# Patient Record
Sex: Female | Born: 1985 | Hispanic: Yes | Marital: Married | Smoking: Never smoker
Health system: Southern US, Community
[De-identification: ages and names within clinical notes are randomized; demographics above are authoritative.]

## PROBLEM LIST (undated history)

## (undated) HISTORY — PX: NO PAST SURGERIES: SHX2092

---

## 2012-09-07 NOTE — L&D Delivery Note (Signed)
Delivery Note At 1:54 AM a viable female was delivered via Vaginal, Spontaneous Delivery (Presentation: Left Occiput Anterior).  APGAR: 4, 8; weight 4 lb 11.6 oz (2143 g).   Placenta status: Intact, Spontaneous Pathology.  Cord: 3 vessels with the following complications: None.  Cord pH: 7.25  Anesthesia: None  Episiotomy: None Lacerations: 1st degree Suture Repair: 3.0 vicryl Est. Blood Loss (mL): 300  Mom to postpartum.  Baby to nursery-stable.  Pt with progressively stronger contractions progressed to c/c/+2. She pushed with good effort to deliver a liveborn viable preterm female infant. Nuchal x1 reduced on perineum. Cord was clamped and cut immediately and infant was delivered to the waiting NICU team.  Cord pH of 7.25.  Placenta delivered spontaneously intact with 3V cord. Small 1st degree repaired with single stitch.  Mom to 3rd floor and baby to NICU.   [name] 06/13/2013, 2:24 AM

## 2013-02-09 ENCOUNTER — Other Ambulatory Visit (HOSPITAL_COMMUNITY): Payer: Self-pay | Admitting: Physician Assistant

## 2013-02-09 DIAGNOSIS — Z3689 Encounter for other specified antenatal screening: Secondary | ICD-10-CM

## 2013-02-09 LAB — OB RESULTS CONSOLE HEPATITIS B SURFACE ANTIGEN: Hepatitis B Surface Ag: NEGATIVE

## 2013-02-09 LAB — OB RESULTS CONSOLE HGB/HCT, BLOOD: Hemoglobin: 11.6 g/dL

## 2013-02-09 LAB — OB RESULTS CONSOLE ANTIBODY SCREEN: Antibody Screen: NEGATIVE

## 2013-02-09 LAB — OB RESULTS CONSOLE ABO/RH: RH Type: POSITIVE

## 2013-02-09 LAB — OB RESULTS CONSOLE GC/CHLAMYDIA
Chlamydia: NEGATIVE
Gonorrhea: NEGATIVE

## 2013-02-09 LAB — OB RESULTS CONSOLE RPR: RPR: NONREACTIVE

## 2013-02-09 LAB — CYTOLOGY - PAP: Pap: NEGATIVE

## 2013-02-14 ENCOUNTER — Encounter (HOSPITAL_COMMUNITY): Payer: Self-pay

## 2013-02-14 ENCOUNTER — Ambulatory Visit (HOSPITAL_COMMUNITY)
Admission: RE | Admit: 2013-02-14 | Discharge: 2013-02-14 | Disposition: A | Discharge status: Home / Self Care | Payer: Self-pay | Source: Ambulatory Visit | Attending: Physician Assistant | Admitting: Physician Assistant

## 2013-02-14 DIAGNOSIS — Z3689 Encounter for other specified antenatal screening: Secondary | ICD-10-CM

## 2013-02-14 DIAGNOSIS — O358XX0 Maternal care for other (suspected) fetal abnormality and damage, not applicable or unspecified: Secondary | ICD-10-CM | POA: Insufficient documentation

## 2013-02-14 DIAGNOSIS — Z363 Encounter for antenatal screening for malformations: Secondary | ICD-10-CM | POA: Insufficient documentation

## 2013-02-14 DIAGNOSIS — Z1389 Encounter for screening for other disorder: Secondary | ICD-10-CM | POA: Insufficient documentation

## 2013-02-16 ENCOUNTER — Other Ambulatory Visit (HOSPITAL_COMMUNITY): Payer: Self-pay | Admitting: Physician Assistant

## 2013-02-16 DIAGNOSIS — Z0489 Encounter for examination and observation for other specified reasons: Secondary | ICD-10-CM

## 2013-02-22 ENCOUNTER — Encounter: Payer: Self-pay | Admitting: *Deleted

## 2013-02-23 ENCOUNTER — Encounter: Payer: Self-pay | Admitting: Family Medicine

## 2013-02-23 ENCOUNTER — Ambulatory Visit (INDEPENDENT_AMBULATORY_CARE_PROVIDER_SITE_OTHER): Payer: Self-pay | Admitting: Family Medicine

## 2013-02-23 ENCOUNTER — Encounter: Payer: Self-pay | Admitting: *Deleted

## 2013-02-23 VITALS — BP 112/69 | Temp 97.9°F | Ht 59.5 in | Wt 136.3 lb

## 2013-02-23 DIAGNOSIS — IMO0002 Reserved for concepts with insufficient information to code with codable children: Secondary | ICD-10-CM

## 2013-02-23 DIAGNOSIS — O0990 Supervision of high risk pregnancy, unspecified, unspecified trimester: Secondary | ICD-10-CM | POA: Insufficient documentation

## 2013-02-23 DIAGNOSIS — O09212 Supervision of pregnancy with history of pre-term labor, second trimester: Secondary | ICD-10-CM

## 2013-02-23 DIAGNOSIS — O09219 Supervision of pregnancy with history of pre-term labor, unspecified trimester: Secondary | ICD-10-CM | POA: Insufficient documentation

## 2013-02-23 DIAGNOSIS — Q897 Multiple congenital malformations, not elsewhere classified: Secondary | ICD-10-CM

## 2013-02-23 DIAGNOSIS — O09299 Supervision of pregnancy with other poor reproductive or obstetric history, unspecified trimester: Secondary | ICD-10-CM | POA: Insufficient documentation

## 2013-02-23 LAB — POCT URINALYSIS DIP (DEVICE)
Bilirubin Urine: NEGATIVE
Ketones, ur: NEGATIVE mg/dL

## 2013-02-23 MED ORDER — HYDROXYPROGESTERONE CAPROATE 250 MG/ML IM OIL
250.0000 mg | TOPICAL_OIL | INTRAMUSCULAR | Status: DC
  Administered 2013-03-09 – 2013-06-08 (×13): 250 mg via INTRAMUSCULAR

## 2013-02-23 NOTE — Patient Instructions (Signed)
Embarazo - Segundo trimestre (Pregnancy - Second Trimester) El segundo trimestre del embarazo (del 3 al 6 mes) es un perodo de evolucin rpida para usted y el beb. Hacia el final del sexto mes, el beb mide aproximadamente 23 cm y pesa 680 g. Comenzar a sentir los movimientos del beb entre las 18 y las 20 semanas de embarazo. Podr sentir las pataditas ("quickening en ingls"). Hay un rpido aumento de peso. Puede segregar un lquido claro (calostro) de las mamas. Quizs sienta pequeas contracciones en el vientre (tero) Esto se conoce como falso trabajo de parto o contracciones de Braxton-Hicks. Es como una prctica del trabajo de parto que se produce cuando el beb est listo para salir. Generalmente los problemas de vmitos matinales ya se han superado hacia el final del primer trimestre. Algunas mujeres desarrollan pequeas manchas oscuras (que se denominan cloasma, mscara del embarazo) en la cara que normalmente se van luego del nacimiento del beb. La exposicin al sol empeora las manchas. Puede desarrollarse acn en algunas mujeres embarazadas, y puede desaparecer en aquellas que ya tienen acn. EXAMENES PRENATALES  Durante los exmenes prenatales, deber seguir realizando pruebas de sangre, segn avance el embarazo. Estas pruebas se realizan para controlar su salud y la del beb. Tambin se realizan anlisis de sangre para conocer los niveles de hemoglobina. La anemia (bajo nivel de hemoglobina) es frecuente durante el embarazo. Para prevenirla, se administran hierro y vitaminas. Tambin se le realizarn exmenes para saber si tiene diabetes entre las 24 y las 28 semanas del embarazo. Podrn repetirle algunas de las pruebas que le hicieron previamente.  En cada visita le medirn el tamao del tero. Esto se realiza para asegurarse de que el beb est creciendo correctamente de acuerdo al estado del embarazo.  Tambin en cada visita prenatal controlarn su presin arterial. Esto se realiza  para asegurarse de que no tenga toxemia.  Se controlar su orina para asegurarse de que no tenga infecciones, diabetes o protena en la orina.  Se controlar su peso regularmente para asegurarse que el aumento ocurre al ritmo indicado. Esto se hace para asegurarse que usted y el beb tienen una evolucin normal.  En algunas ocasiones se realiza una prueba de ultrasonido para confirmar el correcto desarrollo y evolucin del beb. Esta prueba se realiza con ondas sonoras inofensivas para el beb, de modo que el profesional pueda calcular ms precisamente la fecha del parto. Algunas veces se realizan pruebas especializadas del lquido amnitico que rodea al beb. Esta prueba se denomina amniocentesis. El lquido amnitico se obtiene introduciendo una aguja en el vientre (abdomen). Se realiza para controlar los cromosomas en aquellos casos en los que existe alguna preocupacin acerca de algn problema gentico que pueda sufrir el beb. En ocasiones se lleva a cabo cerca del final del embarazo, si es necesario inducir al parto. En este caso se realiza para asegurarse que los pulmones del beb estn lo suficientemente maduros como para que pueda vivir fuera del tero. CAMBIOS QUE OCURREN EN EL SEGUNDO TRIMESTRE DEL EMBARAZO Su organismo atravesar numerosos cambios durante el embarazo. Estos pueden variar de una persona a otra. Converse con el profesional que la asiste acerca los cambios que usted note y que la preocupen.  Durante el segundo trimestre probablemente sienta un aumento del apetito. Es normal tener "antojos" de ciertas comidas. Esto vara de una persona a otra y de un embarazo a otro.  El abdomen inferior comenzar a abultarse.  Podr tener la necesidad de orinar con ms frecuencia debido a   que el tero y el beb presionan sobre la vejiga. Tambin es frecuente contraer ms infecciones urinarias durante el embarazo. Puede evitarlas bebiendo gran cantidad de lquidos y vaciando la vejiga antes y  despus de mantener relaciones sexuales.  Podrn aparecer las primeras estras en las caderas, abdomen y mamas. Estos son cambios normales del cuerpo durante el embarazo. No existen medicamentos ni ejercicios que puedan prevenir estos cambios.  Es posible que comience a desarrollar venas inflamadas y abultadas (varices) en las piernas. El uso de medias de descanso, elevar sus pies durante 15 minutos, 3 a 4 veces al da y limitar la sal en su dieta ayuda a aliviar el problema.  Podr sentir acidez gstrica a medida que el tero crece y presiona contra el estmago. Puede tomar anticidos, con la autorizacin de su mdico, para aliviar este problema. Tambin es til ingerir pequeas comidas 4 a 5 veces al da.  La constipacin puede tratarse con un laxante o agregando fibra a su dieta. Beber grandes cantidades de lquidos, comer vegetales, frutas y granos integrales es de gran ayuda.  Tambin es beneficioso practicar actividad fsica. Si ha sido una persona activa hasta el embarazo, podr continuar con la mayora de las actividades durante el mismo. Si ha sido menos activa, puede ser beneficioso que comience con un programa de ejercicios, como realizar caminatas.  Puede desarrollar hemorroides hacia el final del segundo trimestre. Tomar baos de asiento tibios y utilizar cremas recomendadas por el profesional que lo asiste sern de ayuda para los problemas de hemorroides.  Tambin podr sentir dolor de espalda durante este momento de su embarazo. Evite levantar objetos pesados, utilice zapatos de taco bajo y mantenga una buena postura para ayudar a reducir los problemas de espalda.  Algunas mujeres embarazadas desarrollan hormigueo y adormecimiento de la mano y los dedos debido a la hinchazn y compresin de los ligamentos de la mueca (sndrome del tnel carpiano). Esto desaparece una vez que el beb nace.  Como sus pechos se agrandan, necesitar un sujetador ms grande. Use un sostn de soporte,  cmodo y de algodn. No utilice un sostn para amamantar hasta el ltimo mes de embarazo si va a amamantar al beb.  Podr observar una lnea oscura desde el ombligo hacia la zona pbica denominada linea nigra.  Podr observar que sus mejillas se ponen coloradas debido al aumento de flujo sanguneo en la cara.  Podr desarrollar "araitas" en la cara, cuello y pecho. Esto desaparece una vez que el beb nace. INSTRUCCIONES PARA EL CUIDADO DOMICILIARIO  Es extremadamente importante que evite el cigarrillo, hierbas medicinales, alcohol y las drogas no prescriptas durante el embarazo. Estas sustancias qumicas afectan la formacin y el desarrollo del beb. Evite estas sustancias durante todo el embarazo para asegurar el nacimiento de un beb sano.  La mayor parte de los cuidados que se aconsejan son los mismos que los indicados para el primer trimestre del embarazo. Cumpla con las citas tal como se le indic. Siga las instrucciones del profesional que lo asiste con respecto al uso de los medicamentos, el ejercicio y la dieta.  Durante el embarazo debe obtener nutrientes para usted y para su beb. Consuma alimentos balanceados a intervalos regulares. Elija alimentos como carne, pescado, leche y otros productos lcteos descremados, vegetales, frutas, panes integrales y cereales. El profesional le informar cul es el aumento de peso ideal.  Las relaciones sexuales fsicas pueden continuarse hasta cerca del fin del embarazo si no existen otros problemas. Estos problemas pueden ser la prdida temprana (  prematura) de lquido amnitico de las membranas, sangrado vaginal, dolor abdominal u otros problemas mdicos o del embarazo.  Realice actividad fsica todos los das, si no tiene restricciones. Consulte con el profesional que la asiste si no sabe con certeza si determinados ejercicios son seguros. El mayor aumento de peso tiene lugar durante los ltimos 2 trimestres del embarazo. El ejercicio la ayudar  a:  Controlar su peso.  Ponerla en forma para el parto.  Ayudarla a perder peso luego de haber dado a luz.  Use un buen sostn o como los que se usan para hacer deportes para aliviar la sensibilidad de las mamas. Tambin puede serle til si lo usa mientras duerme. Si pierde calostro, podr utilizar apsitos en el sostn.  No utilice la baera con agua caliente, baos turcos y saunas durante el embarazo.  Utilice el cinturn de seguridad sin excepcin cuando conduzca. Este la proteger a usted y al beb en caso de accidente.  Evite comer carne cruda, queso crudo, y el contacto con los utensilios y desperdicios de los gatos. Estos elementos contienen grmenes que pueden causar defectos de nacimiento en el beb.  El segundo trimestre es un buen momento para visitar a su dentista y evaluar su salud dental si an no lo ha hecho. Es importante mantener los dientes limpios. Utilice un cepillo de dientes blando. Cepllese ms suavemente durante el embarazo.  Es ms fcil perder algo de orina durante el embarazo. Apretar y fortalecer los msculos de la pelvis la ayudar con este problema. Practique detener la miccin cuando est en el bao. Estos son los mismos msculos que necesita fortalecer. Son tambin los mismos msculos que utiliza cuando trata de evitar los gases. Puede practicar apretando estos msculos 10 veces, y repetir esto 3 veces por da aproximadamente. Una vez que conozca qu msculos debe apretar, no realice estos ejercicios durante la miccin. Puede favorecerle una infeccin si la orina vuelve hacia atrs.  Pida ayuda si tiene necesidades econmicas, de asesoramiento o nutricionales durante el embarazo. El profesional podr ayudarla con respecto a estas necesidades, o derivarla a otros especialistas.  La piel puede ponerse grasa. Si esto sucede, lvese la cara con un jabn suave, utilice un humectante no graso y maquillaje con base de aceite o crema. CONSUMO DE MEDICAMENTOS Y DROGAS  DURANTE EL EMBARAZO  Contine tomando las vitaminas apropiadas para esta etapa tal como se le indic. Las vitaminas deben contener un miligramo de cido flico y deben suplementarse con hierro. Guarde todas las vitaminas fuera del alcance de los nios. La ingestin de slo un par de vitaminas o tabletas que contengan hierro puede ocasionar la muerte en un beb o en un nio pequeo.  Evite el uso de medicamentos, inclusive los de venta libre y hierbas que no hayan sido prescriptos o indicados por el profesional que la asiste. Algunos medicamentos pueden causar problemas fsicos al beb. Utilice los medicamentos de venta libre o de prescripcin para el dolor, el malestar o la fiebre, segn se lo indique el profesional que lo asiste. No utilice aspirina.  El consumo de alcohol est relacionado con ciertos defectos de nacimiento. Esto incluye el sndrome de alcoholismo fetal. Debe evitar el consumo de alcohol en cualquiera de sus formas. El cigarrillo causa nacimientos prematuros y bebs de bajo peso. El uso de drogas recreativas est absolutamente prohibido. Son muy nocivas para el beb. Un beb que nace de una madre adicta, ser adicto al nacer. Ese beb tendr los mismos sntomas de abstinencia que un adulto.    Infrmele al profesional si consume alguna droga.  No consuma drogas ilegales. Pueden causarle mucho dao al beb. SOLICITE ATENCIN MDICA SI: Tiene preguntas o preocupaciones durante su embarazo. Es mejor que llame para consultar las dudas que esperar hasta su prxima visita prenatal. De esta forma se sentir ms tranquila.  SOLICITE ATENCIN MDICA DE INMEDIATO SI:  La temperatura oral se eleva sin motivo por encima de 102 F (38.9 C) o segn le indique el profesional que lo asiste.  Tiene una prdida de lquido por la vagina (canal de parto). Si sospecha una ruptura de las membranas, tmese la temperatura y llame al profesional para informarlo sobre esto.  Observa unas pequeas manchas,  una hemorragia vaginal o elimina cogulos. Notifique al profesional acerca de la cantidad y de cuntos apsitos est utilizando. Unas pequeas manchas de sangre son algo comn durante el embarazo, especialmente despus de mantener relaciones sexuales.  Presenta un olor desagradable en la secrecin vaginal y observa un cambio en el color, de transparente a blanco.  Contina con las nuseas y no obtiene alivio de los remedios indicados. Vomita sangre o algo similar a la borra del caf.  [place] o sube ms de 900 g. en una semana, o segn lo indicado por el profesional que la asiste.  Observa que se le hinchan el rostro, las manos, los pies o las piernas.  Ha estado expuesta a la rubola y no ha sufrido la enfermedad.  Ha estado expuesta a la quinta enfermedad o a la varicela.  Presenta dolor abdominal. Las molestias en el ligamento redondo son una causa no cancerosa (benigna) frecuente de dolor abdominal durante el embarazo. El profesional que la asiste deber evaluarla.  Presenta dolor de cabeza intenso que no se alivia.  Presenta fiebre, diarrea, dolor al orinar o le falta la respiracin.  Presenta dificultad para ver, visin borrosa, o visin doble.  Sufre una cada, un accidente de trnsito o cualquier tipo de trauma.  Vive en un hogar en el que existe violencia fsica o mental. Document Released: 06/03/2005 Document Revised: 05/18/2012 [employer] Patient Information 2014 [employer].  Lactancia materna  (Breastfeeding)  El cambio hormonal durante el embarazo produce el desarrollo del tejido mamario y un aumento en el nmero y tamao de los conductos galactforos. La hormona prolactina permite que las protenas, los azcares y las grasas de la sangre produzcan la leche materna en las glndulas productoras de leche. La hormona progesterona impide que la leche materna sea liberada antes del nacimiento del beb. Despus del nacimiento del beb, su nivel de progesterona disminuye  permitiendo que la leche materna sea liberada. Pensar en el beb, as como la succin o el llanto, pueden estimular la liberacin de leche de las glndulas productoras de leche.  La decisin de amamantar (lactar) es una de las mejores opciones que usted puede hacer para usted y su beb. La informacin que sigue da una breve resea de los beneficios, as como otras caractersticas importantes que debe saber sobre la lactancia materna.  LOS BENEFICIOS DE AMAMANTAR  Para el beb   La primera leche (calostro) ayuda al mejor funcionamiento del sistema digestivo del beb.   La leche tiene anticuerpos que provienen de la madre y que ayudan a prevenir las infecciones en el beb.   El beb tiene una menor incidencia de asma, alergias y del sndrome de muerte sbita del lactante (SMSL).   Los nutrientes de la leche materna son mejores para el beb que la leche maternizada.  La leche materna mejora   el desarrollo cerebral del beb.   Su beb tendr menos gases, clicos y estreimiento.  Es menos probable que el beb desarrolle otras enfermedades, como obesidad infantil, asma o diabetes mellitus. Para usted   La lactancia materna favorece el desarrollo de un vnculo muy especial entre la madre y el beb.   Es ms conveniente, siempre disponible, a la temperatura adecuada y econmica.   La lactancia materna ayuda a quemar caloras y a perder el peso ganado durante el embarazo.   Hace que el tero se contraiga ms rpidamente a su tamao normal y disminuye el sangrado despus del parto.   Las madres que amamantan tienen menos riesgo de desarrollar osteoporosis o cncer de mama o de ovario en el futuro.  FRECUENCIA DEL AMAMANTAMIENTO   Un beb sano, nacido a trmino, puede amamantarse con tanta frecuencia como cada hora, o espaciar las comidas cada tres horas. La frecuencia en la lactancia varan de un beb a otro.   Los recin nacidos deben ser alimentados por lo menos cada 2-3 horas  durante el da y cada 4-5 horas durante la noche. Usted debe amamantarlo un mnimo de 8 tomas en un perodo de 24 horas.  Despierte al beb para amamantarlo si han pasado 3-4 horas desde la ltima comida.  Amamante cuando sienta la necesidad de reducir la plenitud de sus senos o cuando el beb muestre signos de hambre. Las seales de que el beb puede tener hambre son:  Aumenta su estado de alerta o vigilancia.  Se estira.  Mueve la cabeza de un lado a otro.  Mueve la cabeza y abre la boca cuando se le toca la mejilla o la boca (reflejo de succin).  Aumenta las vocalizaciones, tales como sonidos de succin, relamerse los labios, arrullos, suspiros, o chirridos.  Mueve la mano hacia la boca.  Se chupa con ganas los dedos o las manos.  Agitacin.  Llanto intermitente.  Los signos de hambre extrema requerirn que lo calme y lo consuele antes de tratar de alimentarlo. Los signos de hambre extrema son:  Agitacin.  Llanto fuerte e intenso.  Gritos.  El amamantamiento frecuente la ayudar a producir ms leche y a prevenir problemas de dolor en los pezones e hinchazn de las mamas.  LACTANCIA MATERNA   Ya sea que se encuentre acostada o sentada, asegrese que el abdomen del beb est enfrente el suyo.   Sostenga la mama con el pulgar por arriba y los otros 4 dedos por debajo del pezn. Asegrese que sus dedos se encuentren lejos del pezn y de la boca del beb.   Empuje suavemente los labios del beb con el pezn o con el dedo.   Cuando la boca del beb se abra lo suficiente, introduzca el pezn y la zona oscura que lo rodea (areola) tanto como le sea posible dentro de la boca.  Debe haber ms areola visible por arriba del labio superior que por debajo del labio inferior.  La lengua del beb debe estar entre la enca inferior y el seno.  Asegrese de que la boca del beb est en la posicin correcta alrededor del pezn (prendida). Los labios del beb deben crear un sello  sobre su pecho.  Las seales de que el beb se ha prendido eficazmente al pezn son:  Tironea o succiona sin dolor.  Se escucha que traga entre las succiones.  No hace ruidos ni chasquidos.  Hay movimientos musculares por arriba y por delante de sus odos al succionar.  El beb debe   succionar unos 2-3 minutos para que salga la leche. Permita que el nio se alimente en cada mama todo lo que desee. Alimente al beb hasta que se desprenda o se quede dormido en el primer pecho y luego ofrzcale el segundo pecho.  Las seales de que el beb est lleno y satisfecho son:  Disminuye gradualmente el nmero de succiones o no succiona.  Se queda dormido.  Extiende o relaja su cuerpo.  Retiene una pequea cantidad de leche en la boca.  Se desprende del pecho por s mismo.  Los signos de una lactancia materna eficaz son:  Los senos han aumentado la firmeza, el peso y el tamao antes de la alimentacin.  Son ms blandos despus de amamantar.  Un aumento del volumen de leche, y tambin el cambio de su consistencia y color se producen hacia el quinto da de lactancia materna.  La congestin mamaria se alivia al dar de mamar.  Los pezones no duelen, ni estn agrietados ni sangran.  De ser necesario, interrumpa la succin poniendo su dedo en la esquina de la boca del beb y deslizando el dedo entre sus encas. A continuacin, retire la mama de su boca.  Es comn que los bebs regurgiten un poco despus de comer.  A menudo los bebs tragan aire al alimentarse. Esto puede hacer que se sienta molesto. Hacer eructar al beb al cambiar de pecho puede ser de ayuda.  Se recomiendan suplementos de vitamina D para los bebs que reciben slo leche materna.  Evite el uso del chupete durante las primeras 4 a 6 semanas de vida.  Evite la alimentacin suplementaria con agua, frmula o jugo en lugar de la leche materna. La leche materna es todo el alimento que el beb necesita. No es necesario que el  nio ingiera agua o preparados de bibern. Sus pechos producirn ms leche si se evita la alimentacin suplementaria durante las primeras semanas. COMO SABER SI EL BEB OBTIENE LA SUFICIENTE LECHE MATERNA  Preguntarse si el beb obtiene la cantidad suficiente de leche es una preocupacin frecuente entre las madres. Puede asegurarse que el beb tiene la leche suficiente si:   El beb succiona activamente y usted escucha que traga.   El beb parece estar relajado y satisfecho despus de mamar.   El nio se alimenta al menos 8 a 12 veces en 24 horas.  Durante los primeros 3 a 5 das de vida:  Moja 3-5 paales en 24 horas. La materia fecal debe ser blanda y amarillenta.  Tiene al menos 3 a 4 deposiciones en 24 horas. La materia fecal debe ser blanda y amarillenta.  A los 5-7 das de vida, el beb debe tener al menos 3-6 deposiciones en 24 horas. La materia fecal debe ser grumosa y amarilla a los 5 das de vida.  Su beb tiene una prdida de peso menor a 7al 10% durante los primeros 3 das de vida.  El beb no pierde peso despus de 3-7 das de vida.  El beb debe aumentar 4 a 6 libras (120 a 170 gr.) por semana despus de los 4 das de vida.  Aumenta de peso a los 5 das de vida y vuelve al peso del nacimiento dentro de las 2 semanas. CONGESTIN MAMARIA  Durante la primera semana despus del parto, usted puede experimentar hinchazn en las mamas (congestin mamaria). Al estar congestionadas, las mamas se sienten pesadas, calientes o sensibles al tacto. El pico de la congestin ocurre a las 24 -48 horas despus del parto.     La congestin puede disminuirse:  Continuando con la lactancia materna.  Aumentando la frecuencia.  Tomando duchas calientes o aplicando calor hmedo en los senos antes de cada comida. Esto aumenta la circulacin y ayuda a que la leche fluya.   Masajeando suavemente el pecho antes y durante las comidas. Con las yemas de los dedos, masajee la pared del pecho hacia  el pezn en un movimiento circular.   Asegurarse de que el beb vaca al menos uno de sus pechos en cada alimentacin. Tambin ayuda si comienza la siguiente toma en el otro seno.   Extraiga manualmente o con un sacaleches las mamas para vaciar los pechos si el beb tiene sueo o no se aliment bien. Tambin puede extraer la leche cuando vuelva a trabajar o si siente que se estn congestionando las mamas.  Asegrese de que el beb se prende y est bien colocado durante la lactancia. Si sigue estas indicaciones, la congestin debe mejorar en 24 a 48 horas. Si an tiene dificultades, consulte a su asesor en lactancia.  CUDESE USTED MISMA  Cuide sus mamas.   Bese o dchese diariamente.   Evite usar jabn en los pezones.   Use un sostn de soporte Evite el uso de sostenes con aro.  Seque al aire sus pezones durante 3-4 minutos despus de cada comida.   Utilice slo apsitos de algodn en el sostn para absorber las prdidas de leche. La prdida de un poco de leche materna entre las comidas es normal.   Use solamente lanolina pura en sus pezones despus de amamantar. Usted no tiene que lavarla antes de alimentar al beb. Otra opcin es sacarse unas gotas de leche y masajear suavemente los pezones.  Continuar con los autocontroles de la mama. Cudese.   Consuma alimentos saludables. Alterne 3 comidas con 3 colaciones.  Evite los alimentos que usted nota que perjudican al beb.  Beba leche, jugos de fruta y agua para satisfacer su sed (aproximadamente 8 vasos al da).   Descanse con frecuencia, reljese y tome sus vitaminas prenatales para evitar la fatiga, el estrs y la anemia.  Evite masticar y fumar tabaco.  Evite el consumo de alcohol y drogas.  Tome medicamentos de venta libre y recetados tal como le indic su mdico o farmacutico. Siempre debe consultar con su mdico o farmacutico antes de tomar cualquier medicamento, vitamina o suplemento de hierbas.  Sepa que  durante la lactancia puede quedar embarazada. Si lo desea, hable con su mdico acerca de la planificacin familiar y los mtodos anticonceptivos seguros que puede utilizar durante la lactancia. SOLICITE ATENCIN MDICA SI:   Usted siente que quiere dejar de amamantar o se siente frustrada con la lactancia.  Siente dolor en los senos o en los pezones.  Sus pezones estn agrietados o sangran.  Sus pechos estn irritados, sensibles o calientes.  Tiene un rea hinchada en cualquiera de los senos.  Siente escalofros o fiebre.  Tiene nuseas o vmitos.  Observa un drenaje en los pezones.  Sus mamas no se llenan antes de amamantarlo al 5to da despus del parto.  Se siente triste y deprimida.  El nio est demasiado somnoliento como para comer.  El nio tiene problemas para respirar.   Moja menos de 3 paales en 24 horas.  Mueve el intestino menos de 3 veces en 24 horas.  La piel del beb o la parte blanca de sus ojos est ms amarilla.   El beb no ha aumentado de peso a los 5 das de vida.   ASEGRESE DE QUE:   Comprende estas instrucciones.  Controlar su enfermedad.  Solicitar ayuda de inmediato si no mejora o si empeora. Document Released: 08/24/2005 Document Revised: 05/18/2012 [employer] Patient Information 2014 [employer].  

## 2013-02-23 NOTE — Progress Notes (Signed)
Transfer from HD for PTB-start 17 P LBW and mulitple congenital anomalies.  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  Pt. Reports he is seen in [facility].  Wants genetics-scheduled and Quad today.

## 2013-02-23 NOTE — Progress Notes (Signed)
MFM appointment scheduled 03/01/13 at 1 pm.

## 2013-02-23 NOTE — Progress Notes (Signed)
Nutrition note: 1st visit consult Pt has lost 6.7# @ [redacted]w[redacted]d. Pt stated she has had a lot of N/V in the first trimester. Pt reports eating 3-4 meals & 3 snacks/d when she can tolerate them. Pt is taking PNV. Pt reports N/V is improving but still has some morning sickness. NKFA Pt received verbal & written education in Spanish on general nutrition during pregnancy via Dora, interpreter. Provided handout on energy dense snacks & discussed tips to decrease N/V. Discussed wt gain goals of 15-25# total or 0.6#/wk. Pt agrees to continue taking PNV & try to add 1-2 energy dense snacks/d. Pt does not have WIC but plans to apply. Pt plans to BF. F/u in 4-6 wks Blondell Reveal, MS, RD, LDN

## 2013-02-23 NOTE — Progress Notes (Signed)
P=92,Here for initial ob visit. Used Engineer, maintenance (IT). States had full , regular period starting about  09/23/12, but then spotted once  in February. States periods are not regular. States had a little pain in Mid abdomen briefly when she stood up. Given new pregnancy information. Discussed bmi and weight gain.

## 2013-02-28 ENCOUNTER — Ambulatory Visit (HOSPITAL_COMMUNITY): Payer: Self-pay

## 2013-03-01 ENCOUNTER — Ambulatory Visit (HOSPITAL_COMMUNITY)
Admission: RE | Admit: 2013-03-01 | Discharge: 2013-03-01 | Disposition: A | Discharge status: Home / Self Care | Payer: Self-pay | Source: Ambulatory Visit | Attending: Family Medicine | Admitting: Family Medicine

## 2013-03-01 ENCOUNTER — Encounter (HOSPITAL_COMMUNITY): Payer: Self-pay

## 2013-03-01 ENCOUNTER — Ambulatory Visit (INDEPENDENT_AMBULATORY_CARE_PROVIDER_SITE_OTHER): Payer: Self-pay

## 2013-03-01 VITALS — BP 95/65 | HR 90 | Wt 137.8 lb

## 2013-03-01 DIAGNOSIS — O352XX0 Maternal care for (suspected) hereditary disease in fetus, not applicable or unspecified: Secondary | ICD-10-CM | POA: Insufficient documentation

## 2013-03-01 DIAGNOSIS — O09219 Supervision of pregnancy with history of pre-term labor, unspecified trimester: Secondary | ICD-10-CM

## 2013-03-01 DIAGNOSIS — Z3689 Encounter for other specified antenatal screening: Secondary | ICD-10-CM | POA: Insufficient documentation

## 2013-03-01 DIAGNOSIS — Z0489 Encounter for examination and observation for other specified reasons: Secondary | ICD-10-CM

## 2013-03-01 NOTE — Progress Notes (Signed)
Genetic Counseling  High-Risk Gestation Note  Appointment Date:  03/01/2013 Referred By: Reva Bores, MD Date of Birth:  08/23/1986 Partner:  Flo Shanks   Pregnancy History: [id number] Estimated Date of Delivery: 07/30/13 Estimated Gestational Age: [redacted]w[redacted]d Attending: Particia Nearing, MD  I met with Ms. Jacklynn Bue for genetic counseling because of family history concerns.  Lorinda Creed, [employer] interpreter, translated during the genetic counseling appointment today.  We began by reviewing the family history in detail.  Ms. Traci Sermon reported that her son was born preterm (35-36 weeks).  He was noted to have multiple congenital anomalies at birth.  Specifically, he had a cleft palate, an absent left thumb, right ear microtia, and a smaller right side of the face as compared to the left.  He also has congenital bilateral hearing loss.  He had a formal developmental assessment at the [facility] in [place]; however, the results were not available for review.  This child is currently 27 years old and receives speech therapy.  His mother feels that he is developmentally on target, with the exception of his speech delay.  This child was reportedly evaluated by genetics at [facility].  Ms. Traci Sermon could not recall whether or not her son had genetic testing or whether a specific diagnosis was given.  She provided consent for me to obtain her son's medical genetics records from [facility].  Those results were requested today.    Ms. Traci Sermon was counseled that the risk assessment provided today is based on the information reported and could change after reviewing her son's records.  We discussed that congenital anomalies can occur as isolated, nonsyndromic birth defects, or as features of an underlying genetic syndrome or association (VATER).  The risk for a genetic etiology increases with the presence of multiple fetal anatomic differences.  We reviewed chromosomes,  aneuploidy, and other chromosome aberrations including microdeletions (22q11 del syndrome), duplications, insertions, and translocations.  Ms. Traci Sermon is not certain whether or not her son had chromosome testing.  We discussed that if he was evaluated by a medical geneticist, it is likely that he had both chromosome testing and microarray analysis.  She was counseled that microarray analysis is a molecular based technique in which a test sample of DNA (fetal) is compared to a reference (normal) genome in order to determine if the test sample has any extra or missing genetic information.  Microarray analysis allows for the detection of genetic deletions and duplications that are 100 times smaller than those identified by routine chromosome analysis.    We then discussed other possible explanations for the above discussed ultrasound findings including single gene conditions.  We reviewed various inheritance patterns and associated risks of recurrence.  In addition, we discussed that multiple congenital anomalies can also result from teratogenic exposures.  Ms. Traci Sermon denied the use of illicit substances, medications, and alcohol during her pregnancy with her son.  We discussed that the described features could be the result of a craniofacial microsomia (Goldenhar syndrome or hemifacial microsomia).  We discussed craniofacial microsomia conditions include a spectrum of malformations primarily involving structures derived from the first and second branchial arches. Findings include facial asymmetry resulting from maxillary and/or mandibular hypoplasia; preauricular or facial tags; ear malformations that can include microtia (hypoplasia of the external ear), anotia (absence of the external ear), or aural atresia (absence of the external ear canal); and hearing loss. Other craniofacial malformations including cleft lip and/or palate can be seen. Non-craniofacial malformations, especially vertebral,  cardiac,  and limb, can be seen.  We discussed that this group of conditions typically occur sporadically due to an unknown etiology, especially in families with simplex cases.  We discussed the risk of recurrence for craniofacial microsomias is typically low, although both recessive and dominant inheritance have been described.  Additionally, we discussed the possibility of an association such as VATER/VACTERL.  Based on the reported information, the patient doesn't appear to have enough features of VATER/VACTERL to meet diagnostic criteria.  It is not certain whether any vertebral x-rays were performed.    The family histories were otherwise found to be noncontributory for birth defects, mental retardation, and known genetic conditions. We discussed that the recurrence risk could be as high as 25% (for autosomal recessive conditions), but could be much lower (in the case of a sporadic condition).  We discussed the option of a detailed ultrasound.  She understands that ultrasound cannot rule out all birth defects or genetic syndromes. The patient was advised of this limitation.  A detailed ultrasound was performed today.  The report will be documented separately.  Given that a specific genetic etiology for Ms. Hernandez's son's differences is not currently known, prenatal genetic testing is not warranted at this time.  Further genetic counseling is indicated if additional information is discovered.  Ms. Traci Sermon was provided with written information regarding sickle cell anemia (SCA) including the carrier frequency and incidence in the Hispanic population, the availability of carrier testing and prenatal diagnosis if indicated.  In addition, we discussed that hemoglobinopathies are routinely screened for as part of the [place] newborn screening panel.  She declined hemoglobin electrophoresis today.   Ms. Traci Sermon denied exposure to environmental toxins or chemical agents. She denied the use of  alcohol, tobacco or street drugs. She denied significant viral illnesses during the course of her pregnancy.   I counseled Ms. Hernandez-Sosa regarding the above risks and available options.  The approximate face-to-face time with the genetic counselor was 42 minutes.  Donald Prose, MS Certified Genetic Counselor  Addendum: 03/14/13 Medical records from [facility] were obtained.  Ms. Milas Kocher son was evaluated by Dr. Salomon Fick, a medical geneticist at [facility], on December 20, 2009.  Physical exam revealed facial asymmetry, right microtia with a preauricular tag, normal left ear, right side of the mouth did not elevate with smiling, and the right eye had a slow blink.  The nose and eyes were nondysmorphic.  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  The neck was short and thick, but without masses or lesions and had normal movement.  The left thumb was noted to be absent.  His behavior and development were normal for age.  Dr. Margretta Ditty impression was that the short neck was concerning for possible Klippel Feil sequence; the combination of features were also concerning for possible hemifacial microsomia or VATER/VACTERL association.  Karyotype and microarray analysis were ordered and found to be within normal limits. 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  There are reported cases of VATER with facial asymmetry, cervical/thoracic abnormalities, and absent thumb.  Assuming this diagnosis, the recurrence risks for the current and future pregnancies is expected to be minimal.  Ms. Traci Sermon was contacted and this information was reviewed.  Donald Prose, MS CGC

## 2013-03-07 ENCOUNTER — Encounter: Payer: Self-pay | Admitting: Family Medicine

## 2013-03-09 ENCOUNTER — Ambulatory Visit (INDEPENDENT_AMBULATORY_CARE_PROVIDER_SITE_OTHER): Payer: Self-pay

## 2013-03-09 VITALS — BP 114/67 | HR 98 | Temp 98.8°F | Wt 137.9 lb

## 2013-03-09 DIAGNOSIS — O09219 Supervision of pregnancy with history of pre-term labor, unspecified trimester: Secondary | ICD-10-CM

## 2013-03-09 DIAGNOSIS — O0990 Supervision of high risk pregnancy, unspecified, unspecified trimester: Secondary | ICD-10-CM

## 2013-03-15 ENCOUNTER — Encounter: Payer: Self-pay | Admitting: Family Medicine

## 2013-03-15 ENCOUNTER — Telehealth (HOSPITAL_COMMUNITY): Payer: Self-pay

## 2013-03-15 NOTE — Telephone Encounter (Signed)
Medical records from [facility] were obtained.  I contacted Alexis Marsh to review the findings.  She was identified by name and DOB.  Ms. Alexis Marsh was evaluated by Dr. Salomon Fick, a medical geneticist at [facility], on December 20, 2009.  Physical exam revealed facial asymmetry, right microtia with a preauricular tag, normal left ear, right side of the mouth did not elevate with smiling, and the right eye had a slow blink.  The nose and eyes were nondysmorphic.  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  The neck was short and thick, but without masses or lesions and had normal movement.  The left thumb was noted to be absent.  His behavior and development were normal for age.  Dr. Margretta Ditty impression was that the short neck was concerning for possible Klippel Feil sequence; the combination of features were also concerning for possible hemifacial microsomia or VATER/VACTERL association.  Karyotype and microarray analysis were ordered and found to be within normal limits. 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  There are reported cases of VATER with facial asymmetry, cervical/thoracic abnormalities, and absent thumb.  Assuming this diagnosis, the recurrence risks for the current and future pregnancies is expected to be minimal.  Ms. Traci Sermon stated that she understood this information.  All questions answered.    Donald Prose, MS  Certified Genetic Counselor

## 2013-03-16 ENCOUNTER — Ambulatory Visit (INDEPENDENT_AMBULATORY_CARE_PROVIDER_SITE_OTHER): Payer: Self-pay | Admitting: Family Medicine

## 2013-03-16 VITALS — BP 92/67 | Temp 98.7°F | Wt 141.0 lb

## 2013-03-16 DIAGNOSIS — O09212 Supervision of pregnancy with history of pre-term labor, second trimester: Secondary | ICD-10-CM

## 2013-03-16 DIAGNOSIS — O09219 Supervision of pregnancy with history of pre-term labor, unspecified trimester: Secondary | ICD-10-CM

## 2013-03-16 DIAGNOSIS — O0990 Supervision of high risk pregnancy, unspecified, unspecified trimester: Secondary | ICD-10-CM

## 2013-03-16 LAB — POCT URINALYSIS DIP (DEVICE)
Bilirubin Urine: NEGATIVE
Hgb urine dipstick: NEGATIVE
Ketones, ur: NEGATIVE mg/dL
Protein, ur: NEGATIVE mg/dL
Specific Gravity, Urine: 1.01 (ref 1.005–1.030)
pH: 6.5 (ref 5.0–8.0)

## 2013-03-16 NOTE — Progress Notes (Signed)
Continue 17 P today--normal anatomy Prev. Child with cong. Anomalies, Per genetics, unlikely to recur

## 2013-03-16 NOTE — Patient Instructions (Signed)
Embarazo - Segundo trimestre (Pregnancy - Second Trimester) El segundo trimestre del embarazo (del 3 al 6 mes) es un perodo de evolucin rpida para usted y el beb. Hacia el final del sexto mes, el beb mide aproximadamente 23 cm y pesa 680 g. Comenzar a sentir los movimientos del beb entre las 18 y las 20 semanas de embarazo. Podr sentir las pataditas ("quickening en ingls"). Hay un rpido aumento de peso. Puede segregar un lquido claro (calostro) de las mamas. Quizs sienta pequeas contracciones en el vientre (tero) Esto se conoce como falso trabajo de parto o contracciones de Braxton-Hicks. Es como una prctica del trabajo de parto que se produce cuando el beb est listo para salir. Generalmente los problemas de vmitos matinales ya se han superado hacia el final del primer trimestre. Algunas mujeres desarrollan pequeas manchas oscuras (que se denominan cloasma, mscara del embarazo) en la cara que normalmente se van luego del nacimiento del beb. La exposicin al sol empeora las manchas. Puede desarrollarse acn en algunas mujeres embarazadas, y puede desaparecer en aquellas que ya tienen acn. EXAMENES PRENATALES  Durante los exmenes prenatales, deber seguir realizando pruebas de sangre, segn avance el embarazo. Estas pruebas se realizan para controlar su salud y la del beb. Tambin se realizan anlisis de sangre para conocer los niveles de hemoglobina. La anemia (bajo nivel de hemoglobina) es frecuente durante el embarazo. Para prevenirla, se administran hierro y vitaminas. Tambin se le realizarn exmenes para saber si tiene diabetes entre las 24 y las 28 semanas del embarazo. Podrn repetirle algunas de las pruebas que le hicieron previamente.  En cada visita le medirn el tamao del tero. Esto se realiza para asegurarse de que el beb est creciendo correctamente de acuerdo al estado del embarazo.  Tambin en cada visita prenatal controlarn su presin arterial. Esto se realiza  para asegurarse de que no tenga toxemia.  Se controlar su orina para asegurarse de que no tenga infecciones, diabetes o protena en la orina.  Se controlar su peso regularmente para asegurarse que el aumento ocurre al ritmo indicado. Esto se hace para asegurarse que usted y el beb tienen una evolucin normal.  En algunas ocasiones se realiza una prueba de ultrasonido para confirmar el correcto desarrollo y evolucin del beb. Esta prueba se realiza con ondas sonoras inofensivas para el beb, de modo que el profesional pueda calcular ms precisamente la fecha del parto. Algunas veces se realizan pruebas especializadas del lquido amnitico que rodea al beb. Esta prueba se denomina amniocentesis. El lquido amnitico se obtiene introduciendo una aguja en el vientre (abdomen). Se realiza para controlar los cromosomas en aquellos casos en los que existe alguna preocupacin acerca de algn problema gentico que pueda sufrir el beb. En ocasiones se lleva a cabo cerca del final del embarazo, si es necesario inducir al parto. En este caso se realiza para asegurarse que los pulmones del beb estn lo suficientemente maduros como para que pueda vivir fuera del tero. CAMBIOS QUE OCURREN EN EL SEGUNDO TRIMESTRE DEL EMBARAZO Su organismo atravesar numerosos cambios durante el embarazo. Estos pueden variar de una persona a otra. Converse con el profesional que la asiste acerca los cambios que usted note y que la preocupen.  Durante el segundo trimestre probablemente sienta un aumento del apetito. Es normal tener "antojos" de ciertas comidas. Esto vara de una persona a otra y de un embarazo a otro.  El abdomen inferior comenzar a abultarse.  Podr tener la necesidad de orinar con ms frecuencia debido a   que el tero y el beb presionan sobre la vejiga. Tambin es frecuente contraer ms infecciones urinarias durante el embarazo. Puede evitarlas bebiendo gran cantidad de lquidos y vaciando la vejiga antes y  despus de mantener relaciones sexuales.  Podrn aparecer las primeras estras en las caderas, abdomen y mamas. Estos son cambios normales del cuerpo durante el embarazo. No existen medicamentos ni ejercicios que puedan prevenir estos cambios.  Es posible que comience a desarrollar venas inflamadas y abultadas (varices) en las piernas. El uso de medias de descanso, elevar sus pies durante 15 minutos, 3 a 4 veces al da y limitar la sal en su dieta ayuda a aliviar el problema.  Podr sentir acidez gstrica a medida que el tero crece y presiona contra el estmago. Puede tomar anticidos, con la autorizacin de su mdico, para aliviar este problema. Tambin es til ingerir pequeas comidas 4 a 5 veces al da.  La constipacin puede tratarse con un laxante o agregando fibra a su dieta. Beber grandes cantidades de lquidos, comer vegetales, frutas y granos integrales es de gran ayuda.  Tambin es beneficioso practicar actividad fsica. Si ha sido una persona activa hasta el embarazo, podr continuar con la mayora de las actividades durante el mismo. Si ha sido menos activa, puede ser beneficioso que comience con un programa de ejercicios, como realizar caminatas.  Puede desarrollar hemorroides hacia el final del segundo trimestre. Tomar baos de asiento tibios y utilizar cremas recomendadas por el profesional que lo asiste sern de ayuda para los problemas de hemorroides.  Tambin podr sentir dolor de espalda durante este momento de su embarazo. Evite levantar objetos pesados, utilice zapatos de taco bajo y mantenga una buena postura para ayudar a reducir los problemas de espalda.  Algunas mujeres embarazadas desarrollan hormigueo y adormecimiento de la mano y los dedos debido a la hinchazn y compresin de los ligamentos de la mueca (sndrome del tnel carpiano). Esto desaparece una vez que el beb nace.  Como sus pechos se agrandan, necesitar un sujetador ms grande. Use un sostn de soporte,  cmodo y de algodn. No utilice un sostn para amamantar hasta el ltimo mes de embarazo si va a amamantar al beb.  Podr observar una lnea oscura desde el ombligo hacia la zona pbica denominada linea nigra.  Podr observar que sus mejillas se ponen coloradas debido al aumento de flujo sanguneo en la cara.  Podr desarrollar "araitas" en la cara, cuello y pecho. Esto desaparece una vez que el beb nace. INSTRUCCIONES PARA EL CUIDADO DOMICILIARIO  Es extremadamente importante que evite el cigarrillo, hierbas medicinales, alcohol y las drogas no prescriptas durante el embarazo. Estas sustancias qumicas afectan la formacin y el desarrollo del beb. Evite estas sustancias durante todo el embarazo para asegurar el nacimiento de un beb sano.  La mayor parte de los cuidados que se aconsejan son los mismos que los indicados para el primer trimestre del embarazo. Cumpla con las citas tal como se le indic. Siga las instrucciones del profesional que lo asiste con respecto al uso de los medicamentos, el ejercicio y la dieta.  Durante el embarazo debe obtener nutrientes para usted y para su beb. Consuma alimentos balanceados a intervalos regulares. Elija alimentos como carne, pescado, leche y otros productos lcteos descremados, vegetales, frutas, panes integrales y cereales. El profesional le informar cul es el aumento de peso ideal.  Las relaciones sexuales fsicas pueden continuarse hasta cerca del fin del embarazo si no existen otros problemas. Estos problemas pueden ser la prdida temprana (  prematura) de lquido amnitico de las membranas, sangrado vaginal, dolor abdominal u otros problemas mdicos o del embarazo.  Realice actividad fsica todos los das, si no tiene restricciones. Consulte con el profesional que la asiste si no sabe con certeza si determinados ejercicios son seguros. El mayor aumento de peso tiene lugar durante los ltimos 2 trimestres del embarazo. El ejercicio la ayudar  a:  Controlar su peso.  Ponerla en forma para el parto.  Ayudarla a perder peso luego de haber dado a luz.  Use un buen sostn o como los que se usan para hacer deportes para aliviar la sensibilidad de las mamas. Tambin puede serle til si lo usa mientras duerme. Si pierde calostro, podr utilizar apsitos en el sostn.  No utilice la baera con agua caliente, baos turcos y saunas durante el embarazo.  Utilice el cinturn de seguridad sin excepcin cuando conduzca. Este la proteger a usted y al beb en caso de accidente.  Evite comer carne cruda, queso crudo, y el contacto con los utensilios y desperdicios de los gatos. Estos elementos contienen grmenes que pueden causar defectos de nacimiento en el beb.  El segundo trimestre es un buen momento para visitar a su dentista y evaluar su salud dental si an no lo ha hecho. Es importante mantener los dientes limpios. Utilice un cepillo de dientes blando. Cepllese ms suavemente durante el embarazo.  Es ms fcil perder algo de orina durante el embarazo. Apretar y fortalecer los msculos de la pelvis la ayudar con este problema. Practique detener la miccin cuando est en el bao. Estos son los mismos msculos que necesita fortalecer. Son tambin los mismos msculos que utiliza cuando trata de evitar los gases. Puede practicar apretando estos msculos 10 veces, y repetir esto 3 veces por da aproximadamente. Una vez que conozca qu msculos debe apretar, no realice estos ejercicios durante la miccin. Puede favorecerle una infeccin si la orina vuelve hacia atrs.  Pida ayuda si tiene necesidades econmicas, de asesoramiento o nutricionales durante el embarazo. El profesional podr ayudarla con respecto a estas necesidades, o derivarla a otros especialistas.  La piel puede ponerse grasa. Si esto sucede, lvese la cara con un jabn suave, utilice un humectante no graso y maquillaje con base de aceite o crema. CONSUMO DE MEDICAMENTOS Y DROGAS  DURANTE EL EMBARAZO  Contine tomando las vitaminas apropiadas para esta etapa tal como se le indic. Las vitaminas deben contener un miligramo de cido flico y deben suplementarse con hierro. Guarde todas las vitaminas fuera del alcance de los nios. La ingestin de slo un par de vitaminas o tabletas que contengan hierro puede ocasionar la muerte en un beb o en un nio pequeo.  Evite el uso de medicamentos, inclusive los de venta libre y hierbas que no hayan sido prescriptos o indicados por el profesional que la asiste. Algunos medicamentos pueden causar problemas fsicos al beb. Utilice los medicamentos de venta libre o de prescripcin para el dolor, el malestar o la fiebre, segn se lo indique el profesional que lo asiste. No utilice aspirina.  El consumo de alcohol est relacionado con ciertos defectos de nacimiento. Esto incluye el sndrome de alcoholismo fetal. Debe evitar el consumo de alcohol en cualquiera de sus formas. El cigarrillo causa nacimientos prematuros y bebs de bajo peso. El uso de drogas recreativas est absolutamente prohibido. Son muy nocivas para el beb. Un beb que nace de una madre adicta, ser adicto al nacer. Ese beb tendr los mismos sntomas de abstinencia que un adulto.    Infrmele al profesional si consume alguna droga.  No consuma drogas ilegales. Pueden causarle mucho dao al beb. SOLICITE ATENCIN MDICA SI: Tiene preguntas o preocupaciones durante su embarazo. Es mejor que llame para consultar las dudas que esperar hasta su prxima visita prenatal. De esta forma se sentir ms tranquila.  SOLICITE ATENCIN MDICA DE INMEDIATO SI:  La temperatura oral se eleva sin motivo por encima de 102 F (38.9 C) o segn le indique el profesional que lo asiste.  Tiene una prdida de lquido por la vagina (canal de parto). Si sospecha una ruptura de las membranas, tmese la temperatura y llame al profesional para informarlo sobre esto.  Observa unas pequeas manchas,  una hemorragia vaginal o elimina cogulos. Notifique al profesional acerca de la cantidad y de cuntos apsitos est utilizando. Unas pequeas manchas de sangre son algo comn durante el embarazo, especialmente despus de mantener relaciones sexuales.  Presenta un olor desagradable en la secrecin vaginal y observa un cambio en el color, de transparente a blanco.  Contina con las nuseas y no obtiene alivio de los remedios indicados. Vomita sangre o algo similar a la borra del caf.  [place] o sube ms de 900 g. en una semana, o segn lo indicado por el profesional que la asiste.  Observa que se le hinchan el rostro, las manos, los pies o las piernas.  Ha estado expuesta a la rubola y no ha sufrido la enfermedad.  Ha estado expuesta a la quinta enfermedad o a la varicela.  Presenta dolor abdominal. Las molestias en el ligamento redondo son una causa no cancerosa (benigna) frecuente de dolor abdominal durante el embarazo. El profesional que la asiste deber evaluarla.  Presenta dolor de cabeza intenso que no se alivia.  Presenta fiebre, diarrea, dolor al orinar o le falta la respiracin.  Presenta dificultad para ver, visin borrosa, o visin doble.  Sufre una cada, un accidente de trnsito o cualquier tipo de trauma.  Vive en un hogar en el que existe violencia fsica o mental. Document Released: 06/03/2005 Document Revised: 05/18/2012 [employer] Patient Information 2014 [employer].  Lactancia materna  (Breastfeeding)  El cambio hormonal durante el embarazo produce el desarrollo del tejido mamario y un aumento en el nmero y tamao de los conductos galactforos. La hormona prolactina permite que las protenas, los azcares y las grasas de la sangre produzcan la leche materna en las glndulas productoras de leche. La hormona progesterona impide que la leche materna sea liberada antes del nacimiento del beb. Despus del nacimiento del beb, su nivel de progesterona disminuye  permitiendo que la leche materna sea liberada. Pensar en el beb, as como la succin o el llanto, pueden estimular la liberacin de leche de las glndulas productoras de leche.  La decisin de amamantar (lactar) es una de las mejores opciones que usted puede hacer para usted y su beb. La informacin que sigue da una breve resea de los beneficios, as como otras caractersticas importantes que debe saber sobre la lactancia materna.  LOS BENEFICIOS DE AMAMANTAR  Para el beb   La primera leche (calostro) ayuda al mejor funcionamiento del sistema digestivo del beb.   La leche tiene anticuerpos que provienen de la madre y que ayudan a prevenir las infecciones en el beb.   El beb tiene una menor incidencia de asma, alergias y del sndrome de muerte sbita del lactante (SMSL).   Los nutrientes de la leche materna son mejores para el beb que la leche maternizada.  La leche materna mejora   el desarrollo cerebral del beb.   Su beb tendr menos gases, clicos y estreimiento.  Es menos probable que el beb desarrolle otras enfermedades, como obesidad infantil, asma o diabetes mellitus. Para usted   La lactancia materna favorece el desarrollo de un vnculo muy especial entre la madre y el beb.   Es ms conveniente, siempre disponible, a la temperatura adecuada y econmica.   La lactancia materna ayuda a quemar caloras y a perder el peso ganado durante el embarazo.   Hace que el tero se contraiga ms rpidamente a su tamao normal y disminuye el sangrado despus del parto.   Las madres que amamantan tienen menos riesgo de desarrollar osteoporosis o cncer de mama o de ovario en el futuro.  FRECUENCIA DEL AMAMANTAMIENTO   Un beb sano, nacido a trmino, puede amamantarse con tanta frecuencia como cada hora, o espaciar las comidas cada tres horas. La frecuencia en la lactancia varan de un beb a otro.   Los recin nacidos deben ser alimentados por lo menos cada 2-3 horas  durante el da y cada 4-5 horas durante la noche. Usted debe amamantarlo un mnimo de 8 tomas en un perodo de 24 horas.  Despierte al beb para amamantarlo si han pasado 3-4 horas desde la ltima comida.  Amamante cuando sienta la necesidad de reducir la plenitud de sus senos o cuando el beb muestre signos de hambre. Las seales de que el beb puede tener hambre son:  Aumenta su estado de alerta o vigilancia.  Se estira.  Mueve la cabeza de un lado a otro.  Mueve la cabeza y abre la boca cuando se le toca la mejilla o la boca (reflejo de succin).  Aumenta las vocalizaciones, tales como sonidos de succin, relamerse los labios, arrullos, suspiros, o chirridos.  Mueve la mano hacia la boca.  Se chupa con ganas los dedos o las manos.  Agitacin.  Llanto intermitente.  Los signos de hambre extrema requerirn que lo calme y lo consuele antes de tratar de alimentarlo. Los signos de hambre extrema son:  Agitacin.  Llanto fuerte e intenso.  Gritos.  El amamantamiento frecuente la ayudar a producir ms leche y a prevenir problemas de dolor en los pezones e hinchazn de las mamas.  LACTANCIA MATERNA   Ya sea que se encuentre acostada o sentada, asegrese que el abdomen del beb est enfrente el suyo.   Sostenga la mama con el pulgar por arriba y los otros 4 dedos por debajo del pezn. Asegrese que sus dedos se encuentren lejos del pezn y de la boca del beb.   Empuje suavemente los labios del beb con el pezn o con el dedo.   Cuando la boca del beb se abra lo suficiente, introduzca el pezn y la zona oscura que lo rodea (areola) tanto como le sea posible dentro de la boca.  Debe haber ms areola visible por arriba del labio superior que por debajo del labio inferior.  La lengua del beb debe estar entre la enca inferior y el seno.  Asegrese de que la boca del beb est en la posicin correcta alrededor del pezn (prendida). Los labios del beb deben crear un sello  sobre su pecho.  Las seales de que el beb se ha prendido eficazmente al pezn son:  Tironea o succiona sin dolor.  Se escucha que traga entre las succiones.  No hace ruidos ni chasquidos.  Hay movimientos musculares por arriba y por delante de sus odos al succionar.  El beb debe   succionar unos 2-3 minutos para que salga la leche. Permita que el nio se alimente en cada mama todo lo que desee. Alimente al beb hasta que se desprenda o se quede dormido en el primer pecho y luego ofrzcale el segundo pecho.  Las seales de que el beb est lleno y satisfecho son:  Disminuye gradualmente el nmero de succiones o no succiona.  Se queda dormido.  Extiende o relaja su cuerpo.  Retiene una pequea cantidad de leche en la boca.  Se desprende del pecho por s mismo.  Los signos de una lactancia materna eficaz son:  Los senos han aumentado la firmeza, el peso y el tamao antes de la alimentacin.  Son ms blandos despus de amamantar.  Un aumento del volumen de leche, y tambin el cambio de su consistencia y color se producen hacia el quinto da de lactancia materna.  La congestin mamaria se alivia al dar de mamar.  Los pezones no duelen, ni estn agrietados ni sangran.  De ser necesario, interrumpa la succin poniendo su dedo en la esquina de la boca del beb y deslizando el dedo entre sus encas. A continuacin, retire la mama de su boca.  Es comn que los bebs regurgiten un poco despus de comer.  A menudo los bebs tragan aire al alimentarse. Esto puede hacer que se sienta molesto. Hacer eructar al beb al cambiar de pecho puede ser de ayuda.  Se recomiendan suplementos de vitamina D para los bebs que reciben slo leche materna.  Evite el uso del chupete durante las primeras 4 a 6 semanas de vida.  Evite la alimentacin suplementaria con agua, frmula o jugo en lugar de la leche materna. La leche materna es todo el alimento que el beb necesita. No es necesario que el  nio ingiera agua o preparados de bibern. Sus pechos producirn ms leche si se evita la alimentacin suplementaria durante las primeras semanas. COMO SABER SI EL BEB OBTIENE LA SUFICIENTE LECHE MATERNA  Preguntarse si el beb obtiene la cantidad suficiente de leche es una preocupacin frecuente entre las madres. Puede asegurarse que el beb tiene la leche suficiente si:   El beb succiona activamente y usted escucha que traga.   El beb parece estar relajado y satisfecho despus de mamar.   El nio se alimenta al menos 8 a 12 veces en 24 horas.  Durante los primeros 3 a 5 das de vida:  Moja 3-5 paales en 24 horas. La materia fecal debe ser blanda y amarillenta.  Tiene al menos 3 a 4 deposiciones en 24 horas. La materia fecal debe ser blanda y amarillenta.  A los 5-7 das de vida, el beb debe tener al menos 3-6 deposiciones en 24 horas. La materia fecal debe ser grumosa y amarilla a los 5 das de vida.  Su beb tiene una prdida de peso menor a 7al 10% durante los primeros 3 das de vida.  El beb no pierde peso despus de 3-7 das de vida.  El beb debe aumentar 4 a 6 libras (120 a 170 gr.) por semana despus de los 4 das de vida.  Aumenta de peso a los 5 das de vida y vuelve al peso del nacimiento dentro de las 2 semanas. CONGESTIN MAMARIA  Durante la primera semana despus del parto, usted puede experimentar hinchazn en las mamas (congestin mamaria). Al estar congestionadas, las mamas se sienten pesadas, calientes o sensibles al tacto. El pico de la congestin ocurre a las 24 -48 horas despus del parto.     La congestin puede disminuirse:  Continuando con la lactancia materna.  Aumentando la frecuencia.  Tomando duchas calientes o aplicando calor hmedo en los senos antes de cada comida. Esto aumenta la circulacin y ayuda a que la leche fluya.   Masajeando suavemente el pecho antes y durante las comidas. Con las yemas de los dedos, masajee la pared del pecho hacia  el pezn en un movimiento circular.   Asegurarse de que el beb vaca al menos uno de sus pechos en cada alimentacin. Tambin ayuda si comienza la siguiente toma en el otro seno.   Extraiga manualmente o con un sacaleches las mamas para vaciar los pechos si el beb tiene sueo o no se aliment bien. Tambin puede extraer la leche cuando vuelva a trabajar o si siente que se estn congestionando las mamas.  Asegrese de que el beb se prende y est bien colocado durante la lactancia. Si sigue estas indicaciones, la congestin debe mejorar en 24 a 48 horas. Si an tiene dificultades, consulte a su asesor en lactancia.  CUDESE USTED MISMA  Cuide sus mamas.   Bese o dchese diariamente.   Evite usar jabn en los pezones.   Use un sostn de soporte Evite el uso de sostenes con aro.  Seque al aire sus pezones durante 3-4 minutos despus de cada comida.   Utilice slo apsitos de algodn en el sostn para absorber las prdidas de leche. La prdida de un poco de leche materna entre las comidas es normal.   Use solamente lanolina pura en sus pezones despus de amamantar. Usted no tiene que lavarla antes de alimentar al beb. Otra opcin es sacarse unas gotas de leche y masajear suavemente los pezones.  Continuar con los autocontroles de la mama. Cudese.   Consuma alimentos saludables. Alterne 3 comidas con 3 colaciones.  Evite los alimentos que usted nota que perjudican al beb.  Beba leche, jugos de fruta y agua para satisfacer su sed (aproximadamente 8 vasos al da).   Descanse con frecuencia, reljese y tome sus vitaminas prenatales para evitar la fatiga, el estrs y la anemia.  Evite masticar y fumar tabaco.  Evite el consumo de alcohol y drogas.  Tome medicamentos de venta libre y recetados tal como le indic su mdico o farmacutico. Siempre debe consultar con su mdico o farmacutico antes de tomar cualquier medicamento, vitamina o suplemento de hierbas.  Sepa que  durante la lactancia puede quedar embarazada. Si lo desea, hable con su mdico acerca de la planificacin familiar y los mtodos anticonceptivos seguros que puede utilizar durante la lactancia. SOLICITE ATENCIN MDICA SI:   Usted siente que quiere dejar de amamantar o se siente frustrada con la lactancia.  Siente dolor en los senos o en los pezones.  Sus pezones estn agrietados o sangran.  Sus pechos estn irritados, sensibles o calientes.  Tiene un rea hinchada en cualquiera de los senos.  Siente escalofros o fiebre.  Tiene nuseas o vmitos.  Observa un drenaje en los pezones.  Sus mamas no se llenan antes de amamantarlo al 5to da despus del parto.  Se siente triste y deprimida.  El nio est demasiado somnoliento como para comer.  El nio tiene problemas para respirar.   Moja menos de 3 paales en 24 horas.  Mueve el intestino menos de 3 veces en 24 horas.  La piel del beb o la parte blanca de sus ojos est ms amarilla.   El beb no ha aumentado de peso a los 5 das de vida.   ASEGRESE DE QUE:   Comprende estas instrucciones.  Controlar su enfermedad.  Solicitar ayuda de inmediato si no mejora o si empeora. Document Released: 08/24/2005 Document Revised: 05/18/2012 [employer] Patient Information 2014 [employer].  

## 2013-03-16 NOTE — Progress Notes (Signed)
Pulse- 87 

## 2013-03-23 ENCOUNTER — Ambulatory Visit (INDEPENDENT_AMBULATORY_CARE_PROVIDER_SITE_OTHER): Payer: Self-pay | Admitting: General Practice

## 2013-03-23 VITALS — BP 97/66 | HR 96 | Temp 97.2°F | Ht 59.0 in | Wt 142.9 lb

## 2013-03-23 DIAGNOSIS — O09212 Supervision of pregnancy with history of pre-term labor, second trimester: Secondary | ICD-10-CM

## 2013-03-23 DIAGNOSIS — O09219 Supervision of pregnancy with history of pre-term labor, unspecified trimester: Secondary | ICD-10-CM

## 2013-03-30 ENCOUNTER — Ambulatory Visit (INDEPENDENT_AMBULATORY_CARE_PROVIDER_SITE_OTHER): Payer: Self-pay | Admitting: General Practice

## 2013-03-30 VITALS — BP 94/67 | HR 93 | Temp 97.4°F | Ht 60.0 in | Wt 144.5 lb

## 2013-03-30 DIAGNOSIS — O09219 Supervision of pregnancy with history of pre-term labor, unspecified trimester: Secondary | ICD-10-CM

## 2013-03-30 DIAGNOSIS — O09212 Supervision of pregnancy with history of pre-term labor, second trimester: Secondary | ICD-10-CM

## 2013-03-31 ENCOUNTER — Encounter: Payer: Self-pay | Admitting: *Deleted

## 2013-04-06 ENCOUNTER — Encounter: Payer: Self-pay | Admitting: Advanced Practice Midwife

## 2013-04-06 ENCOUNTER — Ambulatory Visit (INDEPENDENT_AMBULATORY_CARE_PROVIDER_SITE_OTHER): Payer: Self-pay | Admitting: Advanced Practice Midwife

## 2013-04-06 ENCOUNTER — Encounter: Payer: Self-pay | Admitting: *Deleted

## 2013-04-06 VITALS — BP 97/65 | Wt 146.0 lb

## 2013-04-06 DIAGNOSIS — O09219 Supervision of pregnancy with history of pre-term labor, unspecified trimester: Secondary | ICD-10-CM

## 2013-04-06 DIAGNOSIS — O26842 Uterine size-date discrepancy, second trimester: Secondary | ICD-10-CM

## 2013-04-06 DIAGNOSIS — O26849 Uterine size-date discrepancy, unspecified trimester: Secondary | ICD-10-CM

## 2013-04-06 DIAGNOSIS — O09212 Supervision of pregnancy with history of pre-term labor, second trimester: Secondary | ICD-10-CM

## 2013-04-06 LAB — POCT URINALYSIS DIP (DEVICE)
Hgb urine dipstick: NEGATIVE
Ketones, ur: NEGATIVE mg/dL
Protein, ur: NEGATIVE mg/dL
Specific Gravity, Urine: 1.015 (ref 1.005–1.030)
pH: 6.5 (ref 5.0–8.0)

## 2013-04-06 MED ORDER — RANITIDINE HCL 150 MG PO TABS: 150.0000 mg | ORAL_TABLET | Freq: Two times a day (BID) | ORAL | Status: DC

## 2013-04-06 NOTE — Progress Notes (Signed)
Pulse: 92 Needs refill on PNV

## 2013-04-06 NOTE — Progress Notes (Signed)
Doing well.  Good fetal movement, denies vaginal bleeding, LOF, regular contractions.  Size >Dates, outpatient U/S ordered for growth.

## 2013-04-13 ENCOUNTER — Ambulatory Visit (INDEPENDENT_AMBULATORY_CARE_PROVIDER_SITE_OTHER): Payer: Self-pay | Admitting: General Practice

## 2013-04-13 VITALS — BP 94/68 | HR 95 | Temp 97.3°F | Ht 59.0 in | Wt 145.8 lb

## 2013-04-13 DIAGNOSIS — O09219 Supervision of pregnancy with history of pre-term labor, unspecified trimester: Secondary | ICD-10-CM

## 2013-04-13 DIAGNOSIS — O09212 Supervision of pregnancy with history of pre-term labor, second trimester: Secondary | ICD-10-CM

## 2013-04-18 ENCOUNTER — Encounter: Payer: Self-pay | Admitting: *Deleted

## 2013-04-20 ENCOUNTER — Ambulatory Visit (INDEPENDENT_AMBULATORY_CARE_PROVIDER_SITE_OTHER): Payer: Self-pay | Admitting: Obstetrics and Gynecology

## 2013-04-20 VITALS — BP 96/62 | HR 89 | Ht 59.0 in | Wt 146.0 lb

## 2013-04-20 DIAGNOSIS — O09219 Supervision of pregnancy with history of pre-term labor, unspecified trimester: Secondary | ICD-10-CM

## 2013-04-20 DIAGNOSIS — O09299 Supervision of pregnancy with other poor reproductive or obstetric history, unspecified trimester: Secondary | ICD-10-CM

## 2013-04-27 ENCOUNTER — Ambulatory Visit (INDEPENDENT_AMBULATORY_CARE_PROVIDER_SITE_OTHER): Payer: Self-pay

## 2013-04-27 VITALS — BP 96/64 | HR 93 | Wt 149.0 lb

## 2013-04-27 DIAGNOSIS — O09219 Supervision of pregnancy with history of pre-term labor, unspecified trimester: Secondary | ICD-10-CM

## 2013-05-04 ENCOUNTER — Ambulatory Visit (INDEPENDENT_AMBULATORY_CARE_PROVIDER_SITE_OTHER): Payer: Self-pay | Admitting: Advanced Practice Midwife

## 2013-05-04 ENCOUNTER — Encounter: Payer: Self-pay | Admitting: Advanced Practice Midwife

## 2013-05-04 VITALS — BP 113/70 | Temp 97.1°F | Wt 150.7 lb

## 2013-05-04 DIAGNOSIS — O09219 Supervision of pregnancy with history of pre-term labor, unspecified trimester: Secondary | ICD-10-CM

## 2013-05-04 DIAGNOSIS — Z23 Encounter for immunization: Secondary | ICD-10-CM

## 2013-05-04 DIAGNOSIS — O09212 Supervision of pregnancy with history of pre-term labor, second trimester: Secondary | ICD-10-CM

## 2013-05-04 LAB — POCT URINALYSIS DIP (DEVICE)
Glucose, UA: 100 mg/dL — AB
Hgb urine dipstick: NEGATIVE
Specific Gravity, Urine: 1.02 (ref 1.005–1.030)
Urobilinogen, UA: 0.2 mg/dL (ref 0.0–1.0)
pH: 6.5 (ref 5.0–8.0)

## 2013-05-04 LAB — CBC
HCT: 32.8 % — ABNORMAL LOW (ref 36.0–46.0)
MCH: 29.4 pg (ref 26.0–34.0)
MCV: 85.4 fL (ref 78.0–100.0)
RDW: 14.2 % (ref 11.5–15.5)
WBC: 8.4 10*3/uL (ref 4.0–10.5)

## 2013-05-04 MED ORDER — TETANUS-DIPHTH-ACELL PERTUSSIS 5-2.5-18.5 LF-MCG/0.5 IM SUSP
0.5000 mL | Freq: Once | INTRAMUSCULAR | Status: AC
  Administered 2013-05-04: 0.5 mL via INTRAMUSCULAR

## 2013-05-04 NOTE — Progress Notes (Signed)
P= 103, Used Interpreter. C/o when she sits for a long period and gets up or sometimes even when walking  - feels in private parts feels pain.

## 2013-05-04 NOTE — Progress Notes (Signed)
Describes pain as slight and dull. Only when getting up or walking. No contactions, No leaking or bleeding.

## 2013-05-04 NOTE — Addendum Note (Signed)
Addended by: Jill Side on: 05/04/2013 12:41 PM   Modules accepted: Orders

## 2013-05-04 NOTE — Patient Instructions (Signed)
Embarazo  Tercer trimestre  (Pregnancy - Third Trimester) El tercer trimestre del embarazo (los ltimos 3 meses) es el perodo en el cual tanto usted como su beb crecen con ms rapidez. El beb alcanza un largo de aproximadamente 50 cm. y pesa entre 2,700 y 4,500 kg. El beb gana ms tejido graso y est listo para la vida fuera del cuerpo de la madre. Mientras estn en el interior, los bebs tienen perodos de sueo y vigilia, succionan el pulgar y tienen hipo. Quizs sienta pequeas contracciones del tero. Este es el falso trabajo de parto. Tambin se las conoce como contracciones de Braxton-Hicks . Es como una prctica del parto. Los problemas ms habituales de esta etapa del embarazo incluyen mayor dificultad para respirar, hinchazn de las manos y los pies por retencin de lquidos y la necesidad de orinar con ms frecuencia debido a que el tero y el beb presionan sobre la vejiga.  EXAMENES PRENATALES   Durante los exmenes prenatales, deber seguir realizndose anlisis de sangre. Estas pruebas se realizan para controlar su salud y la del beb. Los anlisis de sangre se realizan para conocer los niveles de algunos compuestos de la sangre (hemoglobina). La anemia (bajo nivel de hemoglobina) es frecuente durante el embarazo. Para prevenirla, se administran hierro y vitaminas. Tambin le tomarn nuevas anlisis para descartar diabetes. Podrn repetirle algunas de las pruebas que le hicieron previamente.  En cada visita le medirn el tamao del tero. Esto permite asegurar que el beb se desarrolla adecuadamente, segn la fecha del embarazo.  Le controlarn la presin arterial en cada visita prenatal. Esto es para asegurarse de que no sufre toxemia.  Le harn un anlisis de orina en cada visita prenatal, para descartar infecciones, diabetes y la presencia de protenas.  Tambin en cada visita controlarn su peso. Esto se realiza para asegurarse que aumenta de peso al ritmo indicado y que usted y su  beb evolucionan normalmente.  En algunas ocasiones se realiza una prueba de ultrasonido para confirmar el correcto desarrollo y evolucin del beb. Esta prueba se realiza con ondas sonoras inofensivas para el beb, de modo que el profesional pueda calcular ms precisamente la fecha del parto.  Analice con su mdico los analgsicos y la anestesia que recibir durante el trabajo de parto y el parto.  Comente la posibilidad de que necesite una cesrea y qu anestesia se recibir.  Informe a su mdico si sufre violencia familiar mental o fsica. A veces, se indica la prueba especializada sin estrs, la prueba de tolerancia a las contracciones y el perfil biofsico para asegurarse de que el beb no tiene problemas. El estudio del lquido amnitico que rodea al beb se llama amniocentesis. El lquido amnitico se obtiene introduciendo una aguja en el vientre (abdomen ). En ocasiones se lleva a cabo cerca del final del embarazo, si es necesario inducir a un parto. En este caso se realiza para asegurarse que los pulmones del beb estn lo suficientemente maduros como para que pueda vivir fuera del tero. Si los pulmones no han madurado y es peligroso que el beb nazca, se administrar a la madre una inyeccin de cortisona , 1 a 2 das antes del parto. . Esto ayuda a que los pulmones del beb maduren y sea ms seguro su nacimiento.  CAMBIOS QUE OCURREN EN EL TERCER TRIMESTRE DEL EMBARAZO  Su organismo atravesar numerosos cambios durante el embarazo. Estos pueden variar de una persona a otra. Converse con el profesional que la asiste acerca los cambios que   usted note y que la preocupen.   Durante el ltimo trimestre probablemente sienta un aumento del apetito. Es normal tener "antojos" de ciertas comidas. Esto vara de una persona a otra y de un embarazo a otro.  Podrn aparecer las primeras estras en las caderas, abdomen y mamas. Estos son cambios normales del cuerpo durante el embarazo. No existen  medicamentos ni ejercicios que puedan prevenir estos cambios.  La constipacin puede tratarse con un laxante o agregando fibra a su dieta. Beber grandes cantidades de lquidos, tomar fibras en forma de vegetales, frutas y granos integrales es de gran ayuda.  Tambin es beneficioso practicar actividad fsica. Si ha sido una persona activa hasta el embarazo, podr continuar con la mayora de las actividades durante el mismo. Si ha sido menos activa, puede ser beneficioso que comience con un programa de ejercicios, como realizar caminatas. Consulte con el profesional que la asiste antes de comenzar un programa de ejercicios.  Evite el consumo de cigarrillos, el alcohol, los medicamentos no recetados y las "drogas de la calle" durante el embarazo. Estas sustancias qumicas afectan la formacin y el desarrollo del beb. Evite estas sustancias durante todo el embarazo para asegurar el nacimiento de un beb sano.  Podr sentir dolor de espalda, tener vrices en las venas y hemorroides, o si ya los sufra, pueden empeorar.  Durante el tercer trimestre se cansar con ms facilidad, lo cual es normal.  Los movimientos del beb pueden ser ms fuertes y con ms frecuencia.  Puede que note dificultades para respirar normalmente.  El ombligo puede salir hacia afuera.  A veces sale una secrecin amarilla de las mamas, que se llama calostro.  Podr aparecer una secrecin mucosa con sangre. Esto suele ocurrir entre unos pocos das y una semana antes del parto. INSTRUCCIONES PARA EL CUIDADO EN EL HOGAR   Cumpla con las citas de control. Siga las indicaciones del mdico con respecto al uso de medicamentos, los ejercicios y la dieta.  Durante el embarazo debe obtener nutrientes para usted y para su beb. Consuma alimentos balanceados a intervalos regulares. Elija alimentos como carne, pescado, leche y otros productos lcteos descremados, vegetales, frutas, panes integrales y cereales. El mdico le informar  cul es el aumento de peso ideal.  Las relaciones sexuales pueden continuarse hasta casi el final del embarazo, si no se presentan otros problemas como prdida prematura (antes de tiempo) de lquido amnitico, hemorragia vaginal o dolor en el vientre (abdominal).  Realice actividad fsica todos los das, si no tiene restricciones. Consulte con el profesional que la asiste si no sabe con certeza si determinados ejercicios son seguros. El mayor aumento de peso se producir en los ltimos 2 trimestres del embarazo. El ejercicio ayuda a:  Controlar su peso.  Mantenerse en forma para el trabajo de parto y el parto .  Perder peso despus del parto.  Haga reposo con frecuencia, con las piernas elevadas, o segn lo necesite para evitar los calambres y el dolor de cintura.  Use un buen sostn o como los que se usan para hacer deportes para aliviar la sensibilidad de las mamas. Tambin puede serle til si lo usa mientras duerme. Si pierde calostro, podr utilizar apsitos en el sostn.  No utilice la baera con agua caliente, baos turcos y saunas.   Colquese el cinturn de seguridad cuando conduzca. Este la proteger a usted y al beb en caso de accidente.  Evite comer carne cruda y el contacto con los utensilios y desperdicios de los gatos. Estos   elementos contienen grmenes que pueden causar defectos de nacimiento en el beb.  Es fcil perder algo de orina durante el embarazo. Apretar y fortalecer los msculos de la pelvis la ayudar con este problema. Practique detener la miccin cuando est en el bao. Estos son los mismos msculos que necesita fortalecer. Son tambin los mismos msculos que utiliza cuando trata de evitar despedir gases. Puede practicar apretando estos msculos diez veces, y repetir esto tres veces por da aproximadamente. Una vez que conozca qu msculos debe apretar, no realice estos ejercicios durante la miccin. Puede favorecerle una infeccin si la orina vuelve hacia  atrs.  Pida ayuda si tienen necesidades financieras, teraputicas o nutricionales. El profesional podr ayudarla con respecto a estas necesidades, o derivarla a otros especialistas.  Haga una lista de nmeros telefnicos de emergencia y tngalos disponibles.  Planifique como obtener ayuda de familiares o amigos cuando regrese a casa desde el hospital.  Hacer un ensayo sobre la partida al hospital.  Tome clases prenatales con el padre para entender, practicar y hacer preguntas sobre el trabajo de parto y el alumbramiento.  Preparar la habitacin del beb / busque una guardera.  No viaje fuera de la ciudad a menos que sea absolutamente necesario y con el asesoramiento de su mdico.  Use slo zapatos de tacn bajo o sin tacn para tener mejor equilibrio y evitar cadas. USO DE MEDICAMENTOS Y CONSUMO DE DROGAS DURANTE EL EMBARAZO   Tome las vitaminas apropiadas para esta etapa tal como se le indic. Las vitaminas deben contener un miligramo de cido flico. Guarde todas las vitaminas fuera del alcance de los nios. La ingestin de slo un par de vitaminas o tabletas que contengan hierro pueden ocasionar la muerte en un beb o en un nio pequeo.  Evite el uso de todos los medicamentos, incluyendo hierbas, medicamentos de venta libre, sin receta o que no hayan sido sugeridos por su mdico. Slo tome medicamentos de venta libre o medicamentos recetados para el dolor, el malestar o fiebre como lo indique su mdico. No tome aspirina, ibuprofeno o naproxeno excepto que su mdico se lo indique.  Infrmele al profesional si consume alguna droga.  El alcohol se relaciona con ciertos defectos congnitos. Incluye el sndrome de alcoholismo fetal. Debe evitar absolutamente el consumo de alcohol, en cualquier forma. El fumar produce baja tasa de natalidad y bebs prematuros.  Las drogas ilegales o de la calle son muy perjudiciales para el beb. Estn absolutamente prohibidas. Un beb que nace de una  madre adicta, ser adicto al nacer. Ese beb tendr los mismos sntomas de abstinencia que un adulto. SOLICITE ATENCIN MDICA SI:  Tiene preguntas o preocupaciones relacionadas con el embarazo. Es mejor que llame para formular las preguntas si no puede esperar hasta la prxima visita, que sentirse preocupada por ellas.  SOLICITE ATENCIN MDICA DE INMEDIATO SI:   La temperatura oral le sube a ms de 38,9 C (102 F) o lo que su mdico le indique.  Tiene una prdida de lquido por la vagina (canal de parto). Si sospecha una ruptura de las membranas, tmese la temperatura y llame al profesional para informarlo sobre esto.  Observa unas pequeas manchas, una hemorragia vaginal o elimina cogulos. Notifique al profesional acerca de la cantidad y de cuntos apsitos est utilizando.  Presenta un olor desagradable en la secrecin vaginal y observa un cambio en el color, de transparente a blanco.  Ha vomitado durante ms de 24 horas.  Siente escalofros o le sube la fiebre.  Le   falta el aire.  Siente ardor al orinar.  Baja o sube ms de 2 libras (900 g), o segn lo indicado por el profesional que la asiste.  Observa que sbitamente se le hinchan el rostro, las manos, los pies o las piernas.  Siente dolor en el vientre (abdominal). Las molestias en el ligamento redondo son una causa benigna frecuente de dolor abdominal durante el embarazo. El profesional que la asiste deber evaluarla.  Presenta dolor de cabeza intenso que no se alivia.  Tiene problemas visuales, visin doble o borrosa.  Si no siente los movimientos del beb durante ms de 1 hora. Si piensa que el beb no se mueve tanto como lo haca habitualmente, coma algo que contenga azcar y recustese sobre el lado izquierdo durante una hora. El beb debe moverse al menos 4  5 veces por hora. Comunquese inmediatamente si el beb se mueve menos que lo indicado.  Se cae, se ve involucrada en un accidente automovilstico o sufre algn  tipo de traumatismo.  En su hogar hay violencia mental o fsica. Document Released: 06/03/2005 Document Revised: 05/18/2012 [employer] Patient Information 2014 [employer].  

## 2013-05-04 NOTE — Addendum Note (Signed)
Addended by: Franchot Mimes on: 05/04/2013 12:31 PM   Modules accepted: Orders

## 2013-05-11 ENCOUNTER — Ambulatory Visit (INDEPENDENT_AMBULATORY_CARE_PROVIDER_SITE_OTHER): Payer: Self-pay | Admitting: Obstetrics and Gynecology

## 2013-05-11 VITALS — BP 98/55 | HR 92 | Ht 59.0 in | Wt 151.0 lb

## 2013-05-11 DIAGNOSIS — O09219 Supervision of pregnancy with history of pre-term labor, unspecified trimester: Secondary | ICD-10-CM

## 2013-05-11 DIAGNOSIS — O09213 Supervision of pregnancy with history of pre-term labor, third trimester: Secondary | ICD-10-CM

## 2013-05-18 ENCOUNTER — Ambulatory Visit (INDEPENDENT_AMBULATORY_CARE_PROVIDER_SITE_OTHER): Payer: Self-pay | Admitting: *Deleted

## 2013-05-18 VITALS — BP 100/60 | HR 84 | Wt 153.0 lb

## 2013-05-18 DIAGNOSIS — O09213 Supervision of pregnancy with history of pre-term labor, third trimester: Secondary | ICD-10-CM

## 2013-05-18 DIAGNOSIS — O09219 Supervision of pregnancy with history of pre-term labor, unspecified trimester: Secondary | ICD-10-CM

## 2013-05-24 ENCOUNTER — Encounter: Payer: Self-pay | Admitting: *Deleted

## 2013-05-25 ENCOUNTER — Ambulatory Visit (INDEPENDENT_AMBULATORY_CARE_PROVIDER_SITE_OTHER): Payer: Self-pay | Admitting: Obstetrics & Gynecology

## 2013-05-25 VITALS — BP 99/67 | Temp 98.3°F | Wt 151.0 lb

## 2013-05-25 DIAGNOSIS — O0990 Supervision of high risk pregnancy, unspecified, unspecified trimester: Secondary | ICD-10-CM

## 2013-05-25 DIAGNOSIS — O09213 Supervision of pregnancy with history of pre-term labor, third trimester: Secondary | ICD-10-CM

## 2013-05-25 DIAGNOSIS — O09219 Supervision of pregnancy with history of pre-term labor, unspecified trimester: Secondary | ICD-10-CM

## 2013-05-25 LAB — POCT URINALYSIS DIP (DEVICE)
Bilirubin Urine: NEGATIVE
Glucose, UA: NEGATIVE mg/dL
Hgb urine dipstick: NEGATIVE
Specific Gravity, Urine: 1.025 (ref 1.005–1.030)
Urobilinogen, UA: 0.2 mg/dL (ref 0.0–1.0)

## 2013-05-25 NOTE — Progress Notes (Signed)
Patient is Spanish-speaking only, Spanish interpreter present for this encounter. Continue weekly 17P. No other complaints or concerns.  Fetal movement and labor precautions reviewed.

## 2013-05-25 NOTE — Patient Instructions (Signed)
Regrese a la clinica cuando tenga su cita. Si tiene problemas o preguntas, llama a la clinica o vaya a la sala de emergencia al [facility].    

## 2013-05-25 NOTE — Progress Notes (Signed)
Pulse: 108

## 2013-06-01 ENCOUNTER — Ambulatory Visit (INDEPENDENT_AMBULATORY_CARE_PROVIDER_SITE_OTHER): Payer: Self-pay | Admitting: Obstetrics and Gynecology

## 2013-06-01 VITALS — BP 109/72 | HR 111 | Wt 155.0 lb

## 2013-06-01 DIAGNOSIS — O09213 Supervision of pregnancy with history of pre-term labor, third trimester: Secondary | ICD-10-CM

## 2013-06-01 DIAGNOSIS — O09219 Supervision of pregnancy with history of pre-term labor, unspecified trimester: Secondary | ICD-10-CM

## 2013-06-08 ENCOUNTER — Ambulatory Visit (INDEPENDENT_AMBULATORY_CARE_PROVIDER_SITE_OTHER): Payer: Self-pay | Admitting: Obstetrics & Gynecology

## 2013-06-08 VITALS — BP 101/70 | Temp 97.2°F | Wt 154.8 lb

## 2013-06-08 DIAGNOSIS — O26849 Uterine size-date discrepancy, unspecified trimester: Secondary | ICD-10-CM

## 2013-06-08 DIAGNOSIS — O368190 Decreased fetal movements, unspecified trimester, not applicable or unspecified: Secondary | ICD-10-CM

## 2013-06-08 DIAGNOSIS — O09219 Supervision of pregnancy with history of pre-term labor, unspecified trimester: Secondary | ICD-10-CM

## 2013-06-08 DIAGNOSIS — O09213 Supervision of pregnancy with history of pre-term labor, third trimester: Secondary | ICD-10-CM

## 2013-06-08 DIAGNOSIS — Z23 Encounter for immunization: Secondary | ICD-10-CM

## 2013-06-08 DIAGNOSIS — O0990 Supervision of high risk pregnancy, unspecified, unspecified trimester: Secondary | ICD-10-CM

## 2013-06-08 LAB — POCT URINALYSIS DIP (DEVICE)
Bilirubin Urine: NEGATIVE
Hgb urine dipstick: NEGATIVE
Ketones, ur: NEGATIVE mg/dL
pH: 6.5 (ref 5.0–8.0)

## 2013-06-08 MED ORDER — INFLUENZA VAC SPLIT QUAD 0.5 ML IM SUSP
0.5000 mL | Freq: Once | INTRAMUSCULAR | Status: AC
  Administered 2013-06-08: 0.5 mL via INTRAMUSCULAR

## 2013-06-08 NOTE — Progress Notes (Signed)
Rare contractions, decreased movement. NST will be done. Influenza vaccine and 17-p today.

## 2013-06-08 NOTE — Patient Instructions (Signed)
Evaluacin de los movimientos fetales  (Fetal Movement Counts) Nombre del paciente: __________________________________________________ Fecha de parto estimada: ____________________ La evaluacin de los movimientos fetales es muy recomendable en los embarazos de alto riesgo, pero tambin es una buena idea que lo hagan todas las embarazadas. El mdico le indicar que comience a contarlos a las 28 semanas de embarazo. Los movimientos fetales suelen aumentar:   Despus de una comida completa.  Despus de la actividad fsica.  Despus de comer o beber algo dulce o fro.  En reposo. Preste atencin cuando sienta que el beb est ms activo. Esto le ayudar a notar un patrn de ciclos de vigilia y sueo de su beb y cules son los factores que contribuyen a un aumento de los movimientos fetales. Es importante llevar a cabo un recuento de movimientos fetales, al mismo tiempo cada da, cuando el beb normalmente est ms activo.  CMO CONTAR LOS MOVIMIENTOS FETALES 1. Busque un lugar tranquilo y cmodo para sentarse o recostarse sobre el lado izquierdo. Al recostarse sobre su lado izquierdo, le proporciona una mejor circulacin de sangre y oxgeno al beb. 2. Anote el da y la hora en una hoja de papel o en un diario. 3. Comience contando las pataditas, revoloteos, chasquidos, vueltas o pinchazos en un perodo de 2 horas. Debe sentir al menos 10 movimientos en 2 horas. 4. Si no siente 10 movimientos en 2 horas, espere 2  3 horas y cuente de nuevo. Busque cambios en el patrn o si no cuenta lo suficiente en 2 horas. SOLICITE ATENCIN MDICA SI:   Siente menos de 10 pataditas en 2 horas, en dos intentos.  No hay movimientos durante una hora.  El patrn se modifica o le lleva ms tiempo cada da contar las 10 pataditas.  Siente que el beb no se mueve como lo hace habitualmente. Fecha: ____________ Movimientos: ____________ Hora de inicio: ____________ Hora de finalizacin: ____________  Fecha:  ____________ Movimientos: ____________ Hora de inicio: ____________ Hora de finalizacin: ____________  Fecha: ____________ Movimientos: ____________ Hora de inicio: ____________ Hora de finalizacin: ____________  Fecha: ____________ Movimientos: ____________ Hora de inicio: ____________ Hora de finalizacin: ____________  Fecha: ____________ Movimientos: ____________ Hora de inicio: ____________ Hora de finalizacin: ____________  Fecha: ____________ Movimientos: ____________ Hora de inicio: ____________ Hora de finalizacin: ____________  Fecha: ____________ Movimientos: ____________ Hora de inicio: ____________ Hora de finalizacin: ____________  Fecha: ____________ Movimientos: ____________ Hora de inicio: ____________ Hora de finalizacin: ____________  Fecha: ____________ Movimientos: ____________ Hora de inicio: ____________ Hora de finalizacin: ____________  Fecha: ____________ Movimientos: ____________ Hora de inicio: ____________ Hora de finalizacin: ____________  Fecha: ____________ Movimientos: ____________ Hora de inicio: ____________ Hora de finalizacin: ____________  Fecha: ____________ Movimientos: ____________ Hora de inicio: ____________ Hora de finalizacin: ____________  Fecha: ____________ Movimientos: ____________ Hora de inicio: ____________ Hora de finalizacin: ____________  Fecha: ____________ Movimientos: ____________ Hora de inicio: ____________ Hora de finalizacin: ____________  Fecha: ____________ Movimientos: ____________ Hora de inicio: ____________ Hora de finalizacin: ____________  Fecha: ____________ Movimientos: ____________ Hora de inicio: ____________ Hora de finalizacin: ____________  Fecha: ____________ Movimientos: ____________ Hora de inicio: ____________ Hora de finalizacin: ____________  Fecha: ____________ Movimientos: ____________ Hora de inicio: ____________ Hora de finalizacin: ____________  Fecha: ____________ Movimientos: ____________ Hora  de inicio: ____________ Hora de finalizacin: ____________  Fecha: ____________ Movimientos: ____________ Hora de inicio: ____________ Hora de finalizacin: ____________  Fecha: ____________ Movimientos: ____________ Hora de inicio: ____________ Hora de finalizacin: ____________  Fecha: ____________ Movimientos: ____________ Hora de inicio: ____________ Hora de   finalizacin: ____________  Fecha: ____________ Movimientos: ____________ Hora de inicio: ____________ Hora de finalizacin: ____________  Fecha: ____________ Movimientos: ____________ Hora de inicio: ____________ Hora de finalizacin: ____________  Fecha: ____________ Movimientos: ____________ Hora de inicio: ____________ Hora de finalizacin: ____________  Fecha: ____________ Movimientos: ____________ Hora de inicio: ____________ Hora de finalizacin: ____________  Fecha: ____________ Movimientos: ____________ Hora de inicio: ____________ Hora de finalizacin: ____________  Fecha: ____________ Movimientos: ____________ Hora de inicio: ____________ Hora de finalizacin: ____________  Fecha: ____________ Movimientos: ____________ Hora de inicio: ____________ Hora de finalizacin: ____________  Fecha: ____________ Movimientos: ____________ Hora de inicio: ____________ Hora de finalizacin: ____________  Fecha: ____________ Movimientos: ____________ Hora de inicio: ____________ Hora de finalizacin: ____________  Fecha: ____________ Movimientos: ____________ Hora de inicio: ____________ Hora de finalizacin: ____________  Fecha: ____________ Movimientos: ____________ Hora de inicio: ____________ Hora de finalizacin: ____________  Fecha: ____________ Movimientos: ____________ Hora de inicio: ____________ Hora de finalizacin: ____________  Fecha: ____________ Movimientos: ____________ Hora de inicio: ____________ Hora de finalizacin: ____________  Fecha: ____________ Movimientos: ____________ Hora de inicio: ____________ Hora de finalizacin:  ____________  Fecha: ____________ Movimientos: ____________ Hora de inicio: ____________ Hora de finalizacin: ____________  Fecha: ____________ Movimientos: ____________ Hora de inicio: ____________ Hora de finalizacin: ____________  Fecha: ____________ Movimientos: ____________ Hora de inicio: ____________ Hora de finalizacin: ____________  Fecha: ____________ Movimientos: ____________ Hora de inicio: ____________ Hora de finalizacin: ____________  Fecha: ____________ Movimientos: ____________ Hora de inicio: ____________ Hora de finalizacin: ____________  Fecha: ____________ Movimientos: ____________ Hora de inicio: ____________ Hora de finalizacin: ____________  Fecha: ____________ Movimientos: ____________ Hora de inicio: ____________ Hora de finalizacin: ____________  Fecha: ____________ Movimientos: ____________ Hora de inicio: ____________ Hora de finalizacin: ____________  Fecha: ____________ Movimientos: ____________ Hora de inicio: ____________ Hora de finalizacin: ____________  Fecha: ____________ Movimientos: ____________ Hora de inicio: ____________ Hora de finalizacin: ____________  Fecha: ____________ Movimientos: ____________ Hora de inicio: ____________ Hora de finalizacin: ____________  Fecha: ____________ Movimientos: ____________ Hora de inicio: ____________ Hora de finalizacin: ____________  Fecha: ____________ Movimientos: ____________ Hora de inicio: ____________ Hora de finalizacin: ____________  Fecha: ____________ Movimientos: ____________ Hora de inicio: ____________ Hora de finalizacin: ____________  Fecha: ____________ Movimientos: ____________ Hora de inicio: ____________ Hora de finalizacin: ____________  Fecha: ____________ Movimientos: ____________ Hora de inicio: ____________ Hora de finalizacin: ____________  Fecha: ____________ Movimientos: ____________ Hora de inicio: ____________ Hora de finalizacin: ____________  Fecha: ____________  Movimientos: ____________ Hora de inicio: ____________ Hora de finalizacin: ____________  Fecha: ____________ Movimientos: ____________ Hora de inicio: ____________ Hora de finalizacin: ____________  Fecha: ____________ Movimientos: ____________ Hora de inicio: ____________ Hora de finalizacin: ____________  Document Released: 12/01/2007 Document Revised: 08/10/2012 [employer] Patient Information 2014 [employer].  

## 2013-06-08 NOTE — Progress Notes (Signed)
Good FM during NST- pt was aware.

## 2013-06-08 NOTE — Progress Notes (Signed)
Pulse: 95 Last few days baby is moving less.

## 2013-06-12 ENCOUNTER — Inpatient Hospital Stay (HOSPITAL_COMMUNITY)
Admission: AD | Admit: 2013-06-12 | Discharge: 2013-06-14 | DRG: 775 | Disposition: A | Discharge status: Home / Self Care | Payer: Medicaid Other | Source: Ambulatory Visit | Attending: Obstetrics & Gynecology | Admitting: Obstetrics & Gynecology

## 2013-06-12 ENCOUNTER — Encounter (HOSPITAL_COMMUNITY): Payer: Self-pay | Admitting: *Deleted

## 2013-06-12 DIAGNOSIS — O4290 Premature rupture of membranes, unspecified as to length of time between rupture and onset of labor, unspecified weeks of gestation: Principal | ICD-10-CM | POA: Diagnosis present

## 2013-06-12 LAB — URINALYSIS, ROUTINE W REFLEX MICROSCOPIC
Bilirubin Urine: NEGATIVE
Specific Gravity, Urine: 1.01 (ref 1.005–1.030)
pH: 6 (ref 5.0–8.0)

## 2013-06-12 LAB — DIFFERENTIAL
Basophils Absolute: 0 10*3/uL (ref 0.0–0.1)
Basophils Relative: 0 % (ref 0–1)
Eosinophils Absolute: 0.1 10*3/uL (ref 0.0–0.7)
Eosinophils Relative: 1 % (ref 0–5)
Monocytes Relative: 7 % (ref 3–12)

## 2013-06-12 LAB — WET PREP, GENITAL: Yeast Wet Prep HPF POC: NONE SEEN

## 2013-06-12 LAB — URINE MICROSCOPIC-ADD ON

## 2013-06-12 LAB — CBC
HCT: 36.7 % (ref 36.0–46.0)
Hemoglobin: 12.8 g/dL (ref 12.0–15.0)
MCH: 29.5 pg (ref 26.0–34.0)
MCHC: 34.9 g/dL (ref 30.0–36.0)
MCV: 84.6 fL (ref 78.0–100.0)
Platelets: 196 10*3/uL (ref 150–400)
RDW: 13.8 % (ref 11.5–15.5)
WBC: 14.3 10*3/uL — ABNORMAL HIGH (ref 4.0–10.5)

## 2013-06-12 LAB — RAPID HIV SCREEN (WH-MAU): Rapid HIV Screen: NONREACTIVE

## 2013-06-12 LAB — HEPATITIS B SURFACE ANTIGEN: Hepatitis B Surface Ag: NEGATIVE

## 2013-06-12 MED ORDER — NIFEDIPINE 10 MG PO CAPS
20.0000 mg | ORAL_CAPSULE | Freq: Once | ORAL | Status: AC
  Administered 2013-06-12: 20 mg via ORAL
  Filled 2013-06-12: qty 2

## 2013-06-12 MED ORDER — BETAMETHASONE SOD PHOS & ACET 6 (3-3) MG/ML IJ SUSP
12.0000 mg | Freq: Once | INTRAMUSCULAR | Status: DC
  Filled 2013-06-12: qty 2

## 2013-06-12 MED ORDER — NIFEDIPINE 10 MG PO CAPS
10.0000 mg | ORAL_CAPSULE | Freq: Four times a day (QID) | ORAL | Status: DC
  Administered 2013-06-12: 10 mg via ORAL
  Filled 2013-06-12: qty 1

## 2013-06-12 MED ORDER — BETAMETHASONE SOD PHOS & ACET 6 (3-3) MG/ML IJ SUSP
12.0000 mg | Freq: Once | INTRAMUSCULAR | Status: AC
  Administered 2013-06-12: 12 mg via INTRAMUSCULAR
  Filled 2013-06-12: qty 2

## 2013-06-12 MED ORDER — DOCUSATE SODIUM 100 MG PO CAPS
100.0000 mg | ORAL_CAPSULE | Freq: Every day | ORAL | Status: DC
  Administered 2013-06-12: 100 mg via ORAL
  Filled 2013-06-12: qty 1

## 2013-06-12 MED ORDER — ZOLPIDEM TARTRATE 5 MG PO TABS: 5.0000 mg | ORAL_TABLET | Freq: Every evening | ORAL | Status: DC | PRN

## 2013-06-12 MED ORDER — MAGNESIUM SULFATE 40 G IN LACTATED RINGERS - SIMPLE
2.0000 g/h | INTRAVENOUS | Status: DC
  Filled 2013-06-12: qty 500

## 2013-06-12 MED ORDER — SODIUM CHLORIDE 0.9 % IV SOLN
2.0000 g | Freq: Four times a day (QID) | INTRAVENOUS | Status: DC
  Administered 2013-06-12 (×3): 2 g via INTRAVENOUS
  Filled 2013-06-12 (×5): qty 2000

## 2013-06-12 MED ORDER — PRENATAL VITAMINS PLUS 27-1 MG PO TABS: 1.0000 | ORAL_TABLET | Freq: Every morning | ORAL | Status: DC

## 2013-06-12 MED ORDER — ERYTHROMYCIN BASE 250 MG PO TBEC: 250.0000 mg | DELAYED_RELEASE_TABLET | Freq: Four times a day (QID) | ORAL | Status: DC

## 2013-06-12 MED ORDER — CALCIUM CARBONATE ANTACID 500 MG PO CHEW: 2.0000 | CHEWABLE_TABLET | ORAL | Status: DC | PRN

## 2013-06-12 MED ORDER — LACTATED RINGERS IV BOLUS (SEPSIS)
1000.0000 mL | Freq: Once | INTRAVENOUS | Status: AC
  Administered 2013-06-12: 1000 mL via INTRAVENOUS

## 2013-06-12 MED ORDER — NIFEDIPINE 10 MG PO CAPS
10.0000 mg | ORAL_CAPSULE | Freq: Once | ORAL | Status: AC
  Administered 2013-06-12: 10 mg via ORAL
  Filled 2013-06-12: qty 1

## 2013-06-12 MED ORDER — TERBUTALINE SULFATE 1 MG/ML IJ SOLN
0.2500 mg | Freq: Once | INTRAMUSCULAR | Status: AC
  Administered 2013-06-12: 0.25 mg via SUBCUTANEOUS
  Filled 2013-06-12: qty 1

## 2013-06-12 MED ORDER — AMPICILLIN 250 MG PO CAPS: 250.0000 mg | ORAL_CAPSULE | Freq: Four times a day (QID) | ORAL | Status: DC

## 2013-06-12 MED ORDER — PRENATAL MULTIVITAMIN CH
1.0000 | ORAL_TABLET | Freq: Every day | ORAL | Status: DC
  Administered 2013-06-12: 1 via ORAL
  Filled 2013-06-12: qty 1

## 2013-06-12 MED ORDER — SODIUM CHLORIDE 0.9 % IV SOLN
250.0000 mg | Freq: Four times a day (QID) | INTRAVENOUS | Status: DC
  Administered 2013-06-12 – 2013-06-13 (×3): 250 mg via INTRAVENOUS
  Filled 2013-06-12 (×5): qty 250

## 2013-06-12 MED ORDER — BETAMETHASONE SOD PHOS & ACET 6 (3-3) MG/ML IJ SUSP
12.0000 mg | Freq: Once | INTRAMUSCULAR | Status: AC
  Administered 2013-06-13: 12 mg via INTRAMUSCULAR
  Filled 2013-06-12: qty 2

## 2013-06-12 MED ORDER — LACTATED RINGERS IV SOLN
INTRAVENOUS | Status: DC
  Administered 2013-06-12: 16:00:00 via INTRAVENOUS

## 2013-06-12 MED ORDER — MAGNESIUM SULFATE BOLUS VIA INFUSION
4.0000 g | Freq: Once | INTRAVENOUS | Status: AC
  Administered 2013-06-12: 4 g via INTRAVENOUS
  Filled 2013-06-12: qty 500

## 2013-06-12 MED ORDER — ACETAMINOPHEN 325 MG PO TABS: 650.0000 mg | ORAL_TABLET | ORAL | Status: DC | PRN

## 2013-06-12 NOTE — MAU Note (Signed)
Patient states she had a small amount of bleeding on the tissue with wiping and lower abdominal pain.

## 2013-06-12 NOTE — MAU Provider Note (Signed)
Attestation of Attending Supervision of Fellow: Evaluation and management procedures were performed by the Fellow under my supervision and collaboration.  I have reviewed the Fellow's note and chart, and I agree with the management and plan.    

## 2013-06-12 NOTE — H&P (Signed)
NICU consult deliver at 34 weeks or sooner for maternal or fetal indications   Attestation of Attending Supervision of Fellow: Evaluation and management procedures were performed by the Fellow under my supervision and collaboration.  I have reviewed the Fellow's note and chart, and I agree with the management and plan.

## 2013-06-12 NOTE — Consult Note (Signed)
Neonatology Consult to Antenatal Patient: 06/12/2013 10:20 PM    Requested by Dr Erin Fulling to consult for PROM at 33 1/[redacted] weeks gestation.  Ms. Alexis Marsh is a 27 y.o. female G2P0101 at [redacted]w[redacted]d presenting for contractions and back pain that started about 6 hours prior to admission today. Significant history of preterm labor, delivered at 35 weeks with her last pregnancy (Infant delivered in [place], [place]). Ms. Alexis Marsh has been followed up with high risk clinic here at [facility] and has been receiving 17-P.  She is presently receiving Ampicillin, Erythromycin, Procardia, MgSO4 and a dose of Betamethasone. PROM around 0100 today (10/6) and has had intermittent uterine contractions and about 2-3 cm dilated per OB exam.  I spoke with Alexis Marsh and FOB in Room 151 with the hospital Spanish Interpreter.   I discussed in detail what to expect in case of possible delivery of the infant in the next few days including DR management, Morbidity and Mortality, possible respiratory complication and need for support, IV access, sepsis work-up (including antibiotic Rx), feeding (BF or formula), length of stay and long-term outcome.  They had a few questions regarding the infant which I answered ( but most was toward OB management which I have requested to be referred to their OB directly).  I offered a NICU tour to any interested family members (but FOB said he is already familiar with the NICU since their first child stayed in the NICU for 20 days) and would be glad to come back if she has more questions later.  Thank you for asking me to see this patient.   Overton Mam, MD (Attending Neonatologist)  Time spent face to face :  25 minutes

## 2013-06-12 NOTE — MAU Note (Signed)
C/O contractions 1 per hour. At 0500, noted some blood in toilet and on tissue with wiping. Not enough to wear a pad.

## 2013-06-12 NOTE — MAU Provider Note (Signed)
History     CSN: [id number]  Arrival date and time: 06/12/13 4098   None     Chief Complaint  Patient presents with  . Abdominal Pain  . Vaginal Bleeding   HPI Spanish interpretor was used during this interview. Ms. Alexis Marsh is a 27 y.o female with a history of preterm labor presents for onset of contractions and bloody show that started at 1 am this morning. Patient has been receiving her prenatal care in the faculty clinic, which includes 17P. She denies any complications with this pregnancy. She is still complaining of contractions. They occur about 1 every 8 minutes. She denies N/V/D.   OB History   Grav Para Term Preterm Abortions TAB SAB Ect Mult Living   2 1  1      1       Past Medical History  Diagnosis Date  . Preterm labor     Past Surgical History  Procedure Laterality Date  . No past surgeries      Family History  Problem Relation Age of Onset  . Hypertension Mother     History  Substance Use Topics  . Smoking status: Never Smoker   . Smokeless tobacco: Never Used  . Alcohol Use: No    Allergies: No Known Allergies  Facility-administered medications prior to admission  Medication Dose Route Frequency Provider Last Rate Last Dose  . hydroxyprogesterone caproate (DELALUTIN) 250 mg/mL injection 250 mg  250 mg Intramuscular Weekly Reva Bores, MD   250 mg at 06/08/13 1011   Prescriptions prior to admission  Medication Sig Dispense Refill  . Prenatal Vit-Fe Fumarate-FA (PRENATAL VITAMINS PLUS PO) Take 1 tablet by mouth daily.      . ranitidine (ZANTAC) 150 MG tablet Take 1 tablet (150 mg total) by mouth 2 (two) times daily.  60 tablet  2    Review of Systems  Constitutional: Negative for fever and chills.  HENT: Negative for sore throat.   Respiratory: Negative for shortness of breath and wheezing.   Cardiovascular: Negative for chest pain, palpitations and orthopnea.  Gastrointestinal: Negative for nausea, vomiting and diarrhea.  Genitourinary:  Negative for dysuria and urgency.  Skin: Negative.   Neurological: Negative for weakness and headaches.   Physical Exam   Blood pressure 116/74, pulse 103, temperature 99.5 F (37.5 C), temperature source Oral, resp. rate 18, height 5\' 1"  (1.549 m), weight 70.761 kg (156 lb), last menstrual period 09/23/2012.  Physical Exam  Constitutional: She is oriented to person, place, and time. She appears well-developed and well-nourished. No distress.  HENT:  Head: Normocephalic and atraumatic.  Cardiovascular: Normal rate, regular rhythm, normal heart sounds and intact distal pulses.   Respiratory: Effort normal and breath sounds normal.  Neurological: She is alert and oriented to person, place, and time.  Skin: Skin is warm and dry. No rash noted. She is not diaphoretic. No erythema. No pallor.  Psychiatric: She has a normal mood and affect.    MAU Course  Procedures  MDM Because of notable history for pre-term labor and delivery, attending was contacted and patient underwent prompt workup.  Assessment and Plan   Patient has hx of pre-term delivery. She is contracting q 4-10 min. Patient had bloody show so fFN is not indicated. She has been on 17P for the duration of her pregnancy and has been complication free.   Will check patient's cervix and depending on dilation/effacement/station will consider admitting for tocolytics and observation.   Bing Plume 06/12/2013, 9:21 AM  I have seen and examined this patient and agree with above documentation in the PA student's note. Please see H&P for further plan  Rulon Abide, M.D. [facility] Fellow 06/12/2013 6:54 PM

## 2013-06-12 NOTE — H&P (Signed)
Alexis Marsh is a 27 y.o. female G2P0101 at [redacted]w[redacted]d presenting for contractions and back pain that started about 6 hours ago. Significant history of preterm labor, delivered at 35 weeks with her last pregnancy. Patient has followed up with high risk clinic and has been receiving 17-P. Maternal Medical History:  Reason for admission: Rupture of membranes and contractions.   Contractions: Onset was 6-12 hours ago.   Frequency: irregular.   Perceived severity is mild.    Fetal activity: Perceived fetal activity is decreased.   Last perceived fetal movement was within the past 12 hours.    Prenatal complications: Preterm labor.   Prenatal Complications - Diabetes: none.    OB History   Grav Para Term Preterm Abortions TAB SAB Ect Mult Living   2 1  1      1      Past Medical History  Diagnosis Date  . Preterm labor    Past Surgical History  Procedure Laterality Date  . No past surgeries     Family History: family history includes Hypertension in her mother. Social History:  reports that she has never smoked. She has never used smokeless tobacco. She reports that she does not drink alcohol or use illicit drugs.   Prenatal Transfer Tool  Maternal Diabetes: No Genetic Screening: Normal Maternal Ultrasounds/Referrals: Normal Fetal Ultrasounds or other Referrals:  Referred to Materal Fetal Medicine  for history of PTL Maternal Substance Abuse:  No Significant Maternal Medications:  Meds include: Progesterone Significant Maternal Lab Results:  None Other Comments:  None Clinic  HRC   Genetic Screen  Quad WNL   Anatomic US  WNL   Glucose Screen  early WNL, 28 wk 133   TDaP vaccine  05/04/13   Flu vaccine    GBS    Baby Food  Breast/bottle   Contraception  OCPs   Circumcision  Undecided     ROS  Dilation: 1.5 Effacement (%): 50 Station: -3 Exam by:: Immanuel Fedak, MD Blood pressure 116/74, pulse 103, temperature 99.5 F (37.5 C), temperature source Oral, resp. rate 18,  height 5\' 1"  (1.549 m), weight 70.761 kg (156 lb), last menstrual period 09/23/2012. Exam BP 116/74  Pulse 103  Temp(Src) 99.5 F (37.5 C) (Oral)  Resp 18  Ht 5\' 1"  (1.549 m)  Wt 70.761 kg (156 lb)  BMI 29.49 kg/m2  LMP 09/23/2012 Gen: NAD. Mild pain with contractions HEENT: AT. [place].  Bilateral eyes without injections or icterus. MMM CV: RRR  Chest: CTAB, no wheeze or crackles Abd: Soft. Gravid. NTND. BS present.   Ext: No erythema. No edema.  Skin: No  rashes, purpura or petechiae.  Neuro: PERLA. EOMi. Alert. Grossly intact.  Psych: No signs of depression  Physical Exam  EFM: 140-150, moderate variability, no decels, accelerations present. CAT 1 tracing Prenatal labs: ABO, Rh: O/Positive/-- (06/05 0000) Antibody: Negative (06/05 0000) Rubella: Immune (06/05 0000) RPR: NON REAC (08/28 1257)  HBsAg: Negative (06/05 0000)  HIV: NON REACTIVE (08/28 1257)  GBS:     Assessment/Plan: [id number] @ [redacted]w[redacted]d  Admit to antenatal for PTL PPROM; grossly ruptured, pulling positive Procardia and BMZ ordered per protocol.  GBS pending; OB labs ordered Continuous monitoring.   Felix Pacini 06/12/2013, 10:25 AM    I have seen and examined this patient and agree with above documentation in the resident's note. Pt presented for bloody show and irregular contractions but on exam was found to be grossly ruptured.  Contractions had spaced out and were less painful at  time of exam.  Cat I tracing. Dated by LMP c/w 18wk [place].   * admit to antepartum - routine orders  *PPROM - latency abx with amp and erythro - monitor fevers - VTX - BMZ given this am on 10/6 with plans to give second dose tomorrow   * ctx - possible early labor - start Mag for tocolysis and CP protection - currently spaced out after procardia x 3.    *dispo pending further evaluation   Rulon Abide, M.D. [facility] Fellow 06/12/2013 2:14 PM

## 2013-06-12 NOTE — MAU Note (Signed)
Patient denied leakage of fluid. Had a tiny wet spot on panties when removed. + pooling with spec. exam. Estimated rupture time at 0800.

## 2013-06-12 NOTE — Plan of Care (Signed)
Problem: Consults Goal: Birthing Suites Patient Information Press F2 to bring up selections list   Pt < [redacted] weeks EGA     

## 2013-06-13 ENCOUNTER — Encounter (HOSPITAL_COMMUNITY): Payer: Self-pay | Admitting: *Deleted

## 2013-06-13 ENCOUNTER — Encounter: Payer: Self-pay | Admitting: Advanced Practice Midwife

## 2013-06-13 DIAGNOSIS — O4290 Premature rupture of membranes, unspecified as to length of time between rupture and onset of labor, unspecified weeks of gestation: Secondary | ICD-10-CM

## 2013-06-13 LAB — GC/CHLAMYDIA PROBE AMP: GC Probe RNA: NEGATIVE

## 2013-06-13 MED ORDER — DIPHENHYDRAMINE HCL 25 MG PO CAPS: 25.0000 mg | ORAL_CAPSULE | Freq: Four times a day (QID) | ORAL | Status: DC | PRN

## 2013-06-13 MED ORDER — ONDANSETRON HCL 4 MG/2ML IJ SOLN: 4.0000 mg | INTRAMUSCULAR | Status: DC | PRN

## 2013-06-13 MED ORDER — FENTANYL CITRATE 0.05 MG/ML IJ SOLN
100.0000 ug | INTRAMUSCULAR | Status: DC | PRN
  Administered 2013-06-13: 100 ug via INTRAVENOUS

## 2013-06-13 MED ORDER — SIMETHICONE 80 MG PO CHEW: 80.0000 mg | CHEWABLE_TABLET | ORAL | Status: DC | PRN

## 2013-06-13 MED ORDER — WITCH HAZEL-GLYCERIN EX PADS: 1.0000 "application " | MEDICATED_PAD | CUTANEOUS | Status: DC | PRN

## 2013-06-13 MED ORDER — OXYTOCIN 40 UNITS IN LACTATED RINGERS INFUSION - SIMPLE MED
INTRAVENOUS | Status: AC
  Administered 2013-06-13: 40 [IU]
  Filled 2013-06-13: qty 1000

## 2013-06-13 MED ORDER — PRENATAL MULTIVITAMIN CH
1.0000 | ORAL_TABLET | Freq: Every day | ORAL | Status: DC
  Administered 2013-06-13: 1 via ORAL
  Filled 2013-06-13: qty 1

## 2013-06-13 MED ORDER — SENNOSIDES-DOCUSATE SODIUM 8.6-50 MG PO TABS: 2.0000 | ORAL_TABLET | Freq: Every day | ORAL | Status: DC

## 2013-06-13 MED ORDER — OXYCODONE-ACETAMINOPHEN 5-325 MG PO TABS
1.0000 | ORAL_TABLET | ORAL | Status: DC | PRN
  Administered 2013-06-13: 1 via ORAL
  Administered 2013-06-14: 2 via ORAL
  Filled 2013-06-13: qty 2
  Filled 2013-06-13: qty 1

## 2013-06-13 MED ORDER — BENZOCAINE-MENTHOL 20-0.5 % EX AERO
1.0000 "application " | INHALATION_SPRAY | CUTANEOUS | Status: DC | PRN
  Filled 2013-06-13: qty 56

## 2013-06-13 MED ORDER — TETANUS-DIPHTH-ACELL PERTUSSIS 5-2.5-18.5 LF-MCG/0.5 IM SUSP: 0.5000 mL | Freq: Once | INTRAMUSCULAR | Status: DC

## 2013-06-13 MED ORDER — MISOPROSTOL 200 MCG PO TABS: 1000.0000 ug | ORAL_TABLET | Freq: Once | ORAL | Status: DC

## 2013-06-13 MED ORDER — MISOPROSTOL 200 MCG PO TABS
ORAL_TABLET | ORAL | Status: AC
  Filled 2013-06-13: qty 5

## 2013-06-13 MED ORDER — FENTANYL CITRATE 0.05 MG/ML IJ SOLN
INTRAMUSCULAR | Status: AC
  Filled 2013-06-13: qty 2

## 2013-06-13 MED ORDER — MEASLES, MUMPS & RUBELLA VAC ~~LOC~~ INJ
0.5000 mL | INJECTION | Freq: Once | SUBCUTANEOUS | Status: DC
  Filled 2013-06-13: qty 0.5

## 2013-06-13 MED ORDER — DIBUCAINE 1 % RE OINT: 1.0000 "application " | TOPICAL_OINTMENT | RECTAL | Status: DC | PRN

## 2013-06-13 MED ORDER — IBUPROFEN 600 MG PO TABS
600.0000 mg | ORAL_TABLET | Freq: Four times a day (QID) | ORAL | Status: DC
  Administered 2013-06-13 – 2013-06-14 (×5): 600 mg via ORAL
  Filled 2013-06-13 (×6): qty 1

## 2013-06-13 MED ORDER — ONDANSETRON HCL 4 MG PO TABS: 4.0000 mg | ORAL_TABLET | ORAL | Status: DC | PRN

## 2013-06-13 MED ORDER — LIDOCAINE HCL (PF) 1 % IJ SOLN
INTRAMUSCULAR | Status: AC
  Administered 2013-06-13: 30 mL
  Filled 2013-06-13: qty 30

## 2013-06-13 MED ORDER — LANOLIN HYDROUS EX OINT: TOPICAL_OINTMENT | CUTANEOUS | Status: DC | PRN

## 2013-06-13 MED ORDER — ZOLPIDEM TARTRATE 5 MG PO TABS: 5.0000 mg | ORAL_TABLET | Freq: Every evening | ORAL | Status: DC | PRN

## 2013-06-13 NOTE — Progress Notes (Signed)
Ferol Laiche is a 27 y.o. G2P0101 at [redacted]w[redacted]d by 16wk ultrasound admitted for PPROM and now with contractions.   Subjective:  pt extremely uncomfortable for the last 2 hours contracting every 2-51minutes and feeling them in her back and abdomen. +FM. No VB. Continued leakage of fluid.   Objective: BP 105/69  Pulse 120  Temp(Src) 99.1 F (37.3 C) (Oral)  Resp 20  Ht 4\' 11"  (1.499 m)  Wt 70.761 kg (156 lb)  BMI 31.49 kg/m2  SpO2 99%  LMP 09/23/2012 I/O last 3 completed shifts: In: 1922.5 [P.O.:790; I.V.:832.5; IV Piggyback:300] Out: 1300 [Urine:1300] Total I/O In: 1270 [P.O.:720; I.V.:500; IV Piggyback:50] Out: 875 [Urine:875]  FHT:  FHR: 145 bpm, variability: moderate,  accelerations:  Present,  decelerations:  Present deep variables initially and now more shallow variables  UC:   regular, every 3-4 minutes SVE:   Dilation: 3 Effacement (%): 80 Station: -2 Exam by:: Dr Reola Calkins  Labs: Lab Results  Component Value Date   WBC 14.3* 06/12/2013   HGB 12.8 06/12/2013   HCT 36.7 06/12/2013   MCV 84.6 06/12/2013   PLT 196 06/12/2013    Assessment / Plan: PPROM now with spontaneous onset of labor.  s/p BMZ on 10/6 in the AM   - SVE changed now from 1.5/50 to 3/80/-2  - given second dose of BMZ 12 hours after the first - pt still on tocolytic mag without improvement in contractions - given a single dose of terbutaline without change in pattern  Transfer to L&D - NICU aware - routine L&D orders - may have epidural  FWB - cat 2 tracing although recently with improved variability.  - cont to monitor for now   Anticipate SVD   Dyesha Henault L 06/13/2013, 1:23 AM

## 2013-06-13 NOTE — Lactation Note (Signed)
This note was copied from the chart of [name]. Lactation Consultation Note   Initial consult with this mom of a NICU baby. Baby is 33 2/[redacted] weeks gestation, and now  10 hours post partum. Mom was started pumping within 6 hours of delivery. Mom 's first child was born with a cleft palate and other facial deformities, and a missing thumb. Mom pumped EBM for her for 6 weeks.  I did teaching on pumping from the NICU booklet on providing EBM , with Eda Royal as mom's spanish interpreter, while mom was holding her baby skin to skin in the NICU. Lactation services also briefly reviewed. Mom knows to call for questions/concerns. I will folow this family in the NICU  Patient Name: [name] [id number] Date: 06/13/2013 Reason for consult: Initial assessment;NICU baby   Maternal Data Formula Feeding for Exclusion: Yes (baby in NICU) Infant to breast within first hour of birth: No Breastfeeding delayed due to:: Infant status Has patient been taught Hand Expression?: Yes Does the patient have breastfeeding experience prior to this delivery?: No  Feeding    LATCH Score/Interventions                      Lactation Tools Discussed/Used Tools: Pump Breast pump type: Double-Electric Breast Pump WIC Program: Yes Pump Review: Setup, frequency, and cleaning;Milk Storage;Other (comment) (NICU booklet on providing EBM, na dhnad expression) Initiated by:: Mortimer Fries, RN, within 6 hours of delivery Date initiated:: 06/13/13   Consult Status Consult Status: Follow-up Date: 06/14/13 Follow-up type: In-patient    Alexis Marsh 06/13/2013, 3:07 PM

## 2013-06-14 ENCOUNTER — Ambulatory Visit: Payer: Self-pay

## 2013-06-14 MED ORDER — IBUPROFEN 600 MG PO TABS: 600.0000 mg | ORAL_TABLET | Freq: Four times a day (QID) | ORAL | Status: DC

## 2013-06-14 MED ORDER — NORETHINDRONE 0.35 MG PO TABS: 1.0000 | ORAL_TABLET | Freq: Every day | ORAL | Status: DC

## 2013-06-14 NOTE — Progress Notes (Signed)
Ur chart review completed.  

## 2013-06-14 NOTE — Discharge Summary (Signed)
Obstetric Discharge Summary Reason for Admission: onset of labor and rupture of membranes 33 weeks Prenatal Procedures: ultrasound Intrapartum Procedures: spontaneous vaginal delivery Postpartum Procedures: none Complications-Operative and Postpartum: none Hemoglobin  Date Value Range Status  06/12/2013 12.8  12.0 - 15.0 g/dL Final  09/12/1094 04.5   Final     HCT  Date Value Range Status  06/12/2013 36.7  36.0 - 46.0 % Final  02/09/2013 34   Final    Physical Exam:  General: alert, cooperative and no distress Lochia: appropriate Uterine Fundus: firm DVT Evaluation: No evidence of DVT seen on physical exam.  Discharge Diagnoses: Premature labor and 33 week delivery  Discharge Information: Date: 06/14/2013 Activity: pelvic rest Diet: routine Medications: PNV, Ibuprofen and micronor Condition: stable Instructions: vaginal birth Discharge to: home Follow-up Information   Follow up with [facility]. Schedule an appointment as soon as possible for a visit in 6 weeks.      Newborn Data: Live born female  Birth Weight: 4 lb 11.6 oz (2143 g) APGAR: 4, 8  NICU  ARNOLD,JAMES 06/14/2013, 8:13 AM

## 2013-06-14 NOTE — Progress Notes (Signed)
Discharge instructions reviewed with patient per interpreter.  Patient states understanding of home care, activity, medications, signs/symptoms to report to MD and return MD office visit.  Patient ambulated for discharge in stable condition with staff without incident.

## 2013-06-14 NOTE — Lactation Note (Signed)
This note was copied from the chart of Alexis Marsh. Lactation Consultation Note   Follow up consult with this mom f a NICU baby, now 34 hours post partum. Mom was able to express 1 ml of colostrum from her left breast this morning. She is being discharge, and is going to [facility] for a DEP. Discharge teaching on frequency and duration, and pump part care, and milk transport to the NICU, cone. I will follow this family  In the NICU  Patient Name: Alexis Marsh Date: 06/14/2013 Reason for consult: Follow-up assessment;NICU baby   Maternal Data    Feeding Feeding Type: Formula Length of feed: 20 min  LATCH Score/Interventions                      Lactation Tools Discussed/Used WIC Program: Yes   Consult Status Consult Status: PRN Follow-up type:  (in NICU)    Alfred Levins 06/14/2013, 6:00 PM

## 2013-06-15 ENCOUNTER — Ambulatory Visit: Payer: Self-pay

## 2013-06-15 LAB — CULTURE, BETA STREP (GROUP B ONLY)

## 2013-06-22 ENCOUNTER — Encounter: Payer: Self-pay | Admitting: Family Medicine

## 2013-07-09 ENCOUNTER — Ambulatory Visit: Payer: Self-pay

## 2013-07-09 NOTE — Lactation Note (Signed)
This note was copied from the chart of Alexis Marsh. Lactation Consultation Note    Follow up consult with this mom and baby, in the NICU, now 37 weeks corrected gestation, and 76 weeks old. i assisted mom with  latching the baby in football hold, and showed mom how to bring the baby to her breast, instead of breast to baby. H e latched easily with strong suckles and visible swalows. Mom knows to call for questions/concerns.   Patient Name: Alexis Tamaria Dunleavy [id number] Date: 07/09/2013 Reason for consult: Follow-up assessment   Maternal Data    Feeding Feeding Type: Bottle Fed - Breast Milk Nipple Type: Slow - flow Length of feed: 20 min  LATCH Score/Interventions Latch: Grasps breast easily, tongue down, lips flanged, rhythmical sucking. Intervention(s): Adjust position;Assist with latch;Breast compression  Audible Swallowing: A few with stimulation  Type of Nipple: Everted at rest and after stimulation  Comfort (Breast/Nipple): Soft / non-tender     Hold (Positioning): Assistance needed to correctly position infant at breast and maintain latch. Intervention(s): Breastfeeding basics reviewed;Support Pillows;Position options  LATCH Score: 8  Lactation Tools Discussed/Used     Consult Status Consult Status: Follow-up Follow-up type:  (prn in NICU)    Alfred Levins 07/09/2013, 5:52 PM

## 2013-07-17 ENCOUNTER — Encounter: Payer: Self-pay | Admitting: Advanced Practice Midwife

## 2013-07-17 ENCOUNTER — Ambulatory Visit (INDEPENDENT_AMBULATORY_CARE_PROVIDER_SITE_OTHER): Payer: Medicaid Other | Admitting: Advanced Practice Midwife

## 2013-07-17 DIAGNOSIS — Z3009 Encounter for other general counseling and advice on contraception: Secondary | ICD-10-CM

## 2013-07-17 MED ORDER — DOCUSATE SODIUM 100 MG PO CAPS: 100.0000 mg | ORAL_CAPSULE | Freq: Two times a day (BID) | ORAL | Status: DC | PRN

## 2013-07-17 NOTE — Progress Notes (Signed)
  Subjective:     Alexis Marsh is a 27 y.o. female who presents for a postpartum visit. She is 5 weeks postpartum following a spontaneous vaginal delivery. I have fully reviewed the prenatal and intrapartum course. The delivery was at 33 gestational weeks. Outcome: spontaneous vaginal delivery. Postpartum course has been normal. Baby's course has been normal.  Infant in NICU until 1 week ago r/t early gestation.  No problems since infant discharge. Baby is feeding by breast and bottle.  Pt reports nipple soreness with feeding.  Bleeding no bleeding. Bowel function is normal but with occasional constipation. Bladder function is normal. Patient is sexually active. Contraception method is oral progesterone-only contraceptive. Postpartum depression screening: negative.  The following portions of the patient's history were reviewed and updated as appropriate: allergies, current medications, past family history, past medical history, past social history, past surgical history and problem list.  Review of Systems A comprehensive review of systems was negative except occasional abdominal cramping, well controlled with Ibuprofen.   Objective:    BP 114/72  Pulse 75  Temp(Src) 98.1 F (36.7 C) (Oral)  Ht 4\' 11"  (1.499 m)  Wt 141 lb 12.8 oz (64.32 kg)  BMI 28.62 kg/m2  Breastfeeding? Yes  General:  alert, cooperative and no distress       Pelvic exam deferred today  Assessment:     Normal postpartum exam.    Plan:    1. Contraception: oral progesterone-only contraceptive 2. F/U if abdominal pain persists or is not controlled with medication 3.  Recommendations for nipple pain: vary infant position at feedings, apply expressed breastmilk to nipples and air dry between feedings, encourage infant to open mouth wide before latch and take in as much breast as possible.  Use ice on nipples prior to feeding or after for comfort. 3. Follow up in: 1 year or as needed.

## 2013-09-07 NOTE — L&D Delivery Note (Signed)
Patient is 28 y.o. [id number] admitted in active labor   Delivery Note At 10:57 AM a viable female was delivered via Vaginal, Spontaneous Delivery (Presentation: Left Occiput Anterior).  APGAR: 9, 9; weight 8 lb 8.5 oz (3870 g).   Placenta status: Intact, Spontaneous.  Cord: 3 vessels with the following complications: None  Anesthesia: None  Episiotomy: None Lacerations: 2nd degree Suture Repair: 3.0 vicryl rapide Est. Blood Loss (mL): 250  Mom to postpartum.  Baby to Couplet care / Skin to Skin.  Alexis Marsh ROCIO 07/20/2014, 1:37 PM

## 2014-02-09 LAB — CHG CYTOPATH CERV/VAG THIN LAYER: Pap Smear: NEGATIVE

## 2014-02-15 ENCOUNTER — Other Ambulatory Visit (HOSPITAL_COMMUNITY): Payer: Self-pay | Admitting: Family

## 2014-02-15 DIAGNOSIS — Z3689 Encounter for other specified antenatal screening: Secondary | ICD-10-CM

## 2014-02-15 LAB — OB RESULTS CONSOLE RPR: RPR: NONREACTIVE

## 2014-02-15 LAB — OB RESULTS CONSOLE HGB/HCT, BLOOD
HCT: 35 %
Hemoglobin: 11.7 g/dL

## 2014-02-15 LAB — OB RESULTS CONSOLE PLATELET COUNT: PLATELETS: 202 10*3/uL

## 2014-02-15 LAB — OB RESULTS CONSOLE HEPATITIS B SURFACE ANTIGEN: Hepatitis B Surface Ag: NEGATIVE

## 2014-02-15 LAB — OB RESULTS CONSOLE GC/CHLAMYDIA
Chlamydia: NEGATIVE
Gonorrhea: NEGATIVE

## 2014-02-15 LAB — OB RESULTS CONSOLE HIV ANTIBODY (ROUTINE TESTING): HIV: NONREACTIVE

## 2014-02-15 LAB — CULTURE, OB URINE
Glucose, 1 Hour GTT: 150
Urine Culture, OB: NEGATIVE

## 2014-02-15 LAB — OB RESULTS CONSOLE VARICELLA ZOSTER ANTIBODY, IGG: Varicella: IMMUNE

## 2014-02-15 LAB — OB RESULTS CONSOLE RUBELLA ANTIBODY, IGM: Rubella: IMMUNE

## 2014-02-19 LAB — GLUCOSE TOLERANCE, 3 HOURS
GLUCOSE 2 HOUR GTT: 153 mg/dL — AB (ref ?–140)
Glucose, Fasting: 82 mg/dL (ref 60–109)
Glucose, GTT - 1 Hour: 179 mg/dL (ref ?–200)
Glucose, GTT - 3 Hour: 121 mg/dL (ref ?–140)

## 2014-02-22 ENCOUNTER — Ambulatory Visit (HOSPITAL_COMMUNITY): Payer: Self-pay

## 2014-02-22 ENCOUNTER — Ambulatory Visit (HOSPITAL_COMMUNITY)
Admission: RE | Admit: 2014-02-22 | Discharge: 2014-02-22 | Disposition: A | Discharge status: Home / Self Care | Payer: Self-pay | Source: Ambulatory Visit | Attending: Family | Admitting: Family

## 2014-02-22 ENCOUNTER — Other Ambulatory Visit (HOSPITAL_COMMUNITY): Payer: Self-pay | Admitting: Family

## 2014-02-22 DIAGNOSIS — Z3689 Encounter for other specified antenatal screening: Secondary | ICD-10-CM | POA: Insufficient documentation

## 2014-03-07 ENCOUNTER — Encounter: Payer: Self-pay | Admitting: *Deleted

## 2014-03-07 DIAGNOSIS — O24419 Gestational diabetes mellitus in pregnancy, unspecified control: Secondary | ICD-10-CM

## 2014-03-07 DIAGNOSIS — O09899 Supervision of other high risk pregnancies, unspecified trimester: Secondary | ICD-10-CM | POA: Insufficient documentation

## 2014-03-07 DIAGNOSIS — Z789 Other specified health status: Secondary | ICD-10-CM | POA: Insufficient documentation

## 2014-03-07 DIAGNOSIS — O09219 Supervision of pregnancy with history of pre-term labor, unspecified trimester: Principal | ICD-10-CM

## 2014-03-07 DIAGNOSIS — O0930 Supervision of pregnancy with insufficient antenatal care, unspecified trimester: Secondary | ICD-10-CM | POA: Insufficient documentation

## 2014-03-08 ENCOUNTER — Ambulatory Visit (INDEPENDENT_AMBULATORY_CARE_PROVIDER_SITE_OTHER): Payer: Self-pay | Admitting: Obstetrics and Gynecology

## 2014-03-08 ENCOUNTER — Encounter: Payer: Self-pay | Admitting: Obstetrics and Gynecology

## 2014-03-08 VITALS — BP 115/69 | HR 96 | Wt 147.1 lb

## 2014-03-08 DIAGNOSIS — O0992 Supervision of high risk pregnancy, unspecified, second trimester: Secondary | ICD-10-CM

## 2014-03-08 DIAGNOSIS — O0990 Supervision of high risk pregnancy, unspecified, unspecified trimester: Secondary | ICD-10-CM

## 2014-03-08 DIAGNOSIS — O09212 Supervision of pregnancy with history of pre-term labor, second trimester: Secondary | ICD-10-CM

## 2014-03-08 DIAGNOSIS — O0932 Supervision of pregnancy with insufficient antenatal care, second trimester: Secondary | ICD-10-CM

## 2014-03-08 DIAGNOSIS — O0930 Supervision of pregnancy with insufficient antenatal care, unspecified trimester: Secondary | ICD-10-CM

## 2014-03-08 DIAGNOSIS — IMO0002 Reserved for concepts with insufficient information to code with codable children: Secondary | ICD-10-CM

## 2014-03-08 DIAGNOSIS — Q897 Multiple congenital malformations, not elsewhere classified: Secondary | ICD-10-CM

## 2014-03-08 DIAGNOSIS — O09892 Supervision of other high risk pregnancies, second trimester: Secondary | ICD-10-CM

## 2014-03-08 DIAGNOSIS — Z603 Acculturation difficulty: Secondary | ICD-10-CM

## 2014-03-08 DIAGNOSIS — Z789 Other specified health status: Secondary | ICD-10-CM

## 2014-03-08 DIAGNOSIS — O09219 Supervision of pregnancy with history of pre-term labor, unspecified trimester: Secondary | ICD-10-CM

## 2014-03-08 LAB — POCT URINALYSIS DIP (DEVICE)
Bilirubin Urine: NEGATIVE
GLUCOSE, UA: NEGATIVE mg/dL
Hgb urine dipstick: NEGATIVE
Ketones, ur: NEGATIVE mg/dL
NITRITE: NEGATIVE
Protein, ur: NEGATIVE mg/dL
SPECIFIC GRAVITY, URINE: 1.01 (ref 1.005–1.030)
UROBILINOGEN UA: 0.2 mg/dL (ref 0.0–1.0)
pH: 6.5 (ref 5.0–8.0)

## 2014-03-08 MED ORDER — HYDROXYPROGESTERONE CAPROATE 250 MG/ML IM OIL
250.0000 mg | TOPICAL_OIL | INTRAMUSCULAR | Status: AC
  Administered 2014-04-05 – 2014-06-14 (×11): 250 mg via INTRAMUSCULAR

## 2014-03-08 NOTE — Patient Instructions (Signed)
Segundo trimestre de embarazo (Second Trimester of Pregnancy) El segundo trimestre va desde la semana13 hasta la 28, desde el cuarto hasta el sexto mes, y suele ser el momento en el que mejor se siente. Su organismo se ha adaptado a estar embarazada y comienza a sentirse fsicamente mejor. En general, las nuseas matutinas han disminuido o han desaparecido completamente, p El segundo trimestre es tambin la poca en la que el feto se desarrolla rpidamente. Hacia el final del sexto mes, el feto mide aproximadamente 9pulgadas (23cm) y pesa alrededor de 1 libras (700g). Es probable que sienta que el beb se mueve (da pataditas) entre las 18 y 20semanas del embarazo. CAMBIOS EN EL ORGANISMO Su organismo atraviesa por muchos cambios durante el embarazo, y estos varan de una mujer a otra.   Seguir aumentando de peso. Notar que la parte baja del abdomen sobresale.  Podrn aparecer las primeras estras en las caderas, el abdomen y las mamas.  Es posible que tenga dolores de cabeza que pueden aliviarse con los medicamentos que su mdico autorice.  Tal vez tenga necesidad de orinar con ms frecuencia porque el feto est ejerciendo presin sobre la vejiga.  Debido al embarazo podr sentir acidez estomacal con frecuencia.  Puede estar estreida, ya que ciertas hormonas enlentecen los movimientos de los msculos que empujan los desechos a travs de los intestinos.  Pueden aparecer hemorroides o abultarse e hincharse las venas (venas varicosas).  Puede tener dolor de espalda que se debe al aumento de peso y a que las hormonas del embarazo relajan las articulaciones entre los huesos de la pelvis, y como consecuencia de la modificacin del peso y los msculos que mantienen el equilibrio.  Las mamas seguirn creciendo y le dolern.  Las encas pueden sangrar y estar sensibles al cepillado y al hilo dental.  Pueden aparecer zonas oscuras o manchas (cloasma, mscara del embarazo) en el rostro que  probablemente se atenuarn despus del nacimiento del beb.  Es posible que se forme una lnea oscura desde el ombligo hasta la zona del pubis (linea nigra) que probablemente se atenuarn despus del nacimiento del beb.  Tal vez haya cambios en el cabello que pueden incluir su engrosamiento, crecimiento rpido y cambios en la textura. Adems, a algunas mujeres se les cae el cabello durante o despus del embarazo, o tienen el cabello seco o fino. Lo ms probable es que el cabello se le normalice despus del nacimiento del beb. QU DEBE ESPERAR EN LAS CONSULTAS PRENATALES Durante una visita prenatal de rutina:  La pesarn para asegurarse de que usted y el feto estn creciendo normalmente.  Le tomarn la presin arterial.  Le medirn el abdomen para controlar el desarrollo del beb.  Se escucharn los latidos cardacos fetales.  Se evaluarn los resultados de los estudios solicitados en visitas anteriores. El mdico puede preguntarle lo siguiente:  Cmo se siente.  Si siente los movimientos del beb.  Si ha tenido sntomas anormales, como prdida de lquido, sangrado, dolores de cabeza intensos o clicos abdominales.  Si tiene alguna pregunta. Otros estudios que podrn realizarse durante el segundo trimestre incluyen lo siguiente:  Anlisis de sangre para detectar:  Concentraciones de hierro bajas (anemia).  Diabetes gestacional (entre la semana 24 y la 28).  Anticuerpos Rh.  Anlisis de orina para detectar infecciones, diabetes o protenas en la orina.  Una ecografa para confirmar que el beb crece y se desarrolla correctamente.  Una amniocentesis para diagnosticar posibles problemas genticos.  Estudios del feto para descartar espina   bfida y sndrome de Down. INSTRUCCIONES PARA EL CUIDADO EN EL HOGAR   Evite fumar, consumir hierbas, beber alcohol y tomar frmacos que no le hayan recetado. Estas sustancias qumicas afectan la formacin y el desarrollo del beb.  Siga  las indicaciones del mdico en relacin con el uso de medicamentos. Durante el embarazo, hay medicamentos que son seguros de tomar y otros que no.  Haga actividad fsica solo en la forma indicada por el mdico. Sentir clicos uterinos es un buen signo para detener la actividad fsica.  Contine comiendo alimentos que sanos con regularidad.  Use un sostn que le brinde buen soporte si le duelen las mamas.  No se d baos de inmersin en agua caliente, baos turcos ni saunas.  Colquese el cinturn de seguridad cuando conduzca.  No coma carne cruda ni queso sin cocinar; evite el contacto con las bandejas sanitarias de los gatos y la tierra que estos animales usan. Estos elementos contienen grmenes que pueden causar defectos congnitos en el beb.  Tome las vitaminas prenatales.  Si est estreida, pruebe un laxante suave (si el mdico lo autoriza). Consuma ms alimentos ricos en fibra, como vegetales y frutas frescos y cereales integrales. Beba gran cantidad de lquido para mantener la orina de tono claro o color amarillo plido.  Dese baos de asiento con agua tibia para aliviar el dolor o las molestias causadas por las hemorroides. Use una crema para las hemorroides si el mdico la autoriza.  Si tiene venas varicosas, use medias de descanso. Eleve los pies durante 15minutos, 3 o 4veces por da. Limite la cantidad de sal en su dieta.  No levante objetos pesados, use zapatos de tacones bajos y mantenga una buena postura.  Descanse con las piernas elevadas si tiene calambres o dolor de cintura.  Visite a su dentista si an no lo ha hecho durante el embarazo. Use un cepillo de dientes blando para higienizarse los dientes y psese el hilo dental con suavidad.  Puede seguir manteniendo relaciones sexuales, a menos que el mdico le indique lo contrario.  Concurra a todas las visitas prenatales segn las indicaciones de su mdico. SOLICITE ATENCIN MDICA SI:   Tiene mareos.  Siente  clicos leves, presin en la pelvis o dolor persistente en el abdomen.  Tiene nuseas, vmitos o diarrea persistentes.  Tiene secrecin vaginal con mal olor.  Siente dolor al orinar. SOLICITE ATENCIN MDICA DE INMEDIATO SI:   Tiene fiebre.  Tiene una prdida de lquido por la vagina.  Tiene sangrado o pequeas prdidas vaginales.  Siente dolor intenso o clicos en el abdomen.  Sube o baja de peso rpidamente.  Tiene dificultad para respirar y siente dolor de pecho.  Sbitamente se le hinchan mucho el rostro, las manos, los tobillos, los pies o las piernas.  No ha sentido los movimientos del beb durante una hora.  Siente un dolor de cabeza intenso que no se alivia con medicamentos.  Hay cambios en la visin. Document Released: 06/03/2005 Document Revised: 08/29/2013 [employer] Patient Information 2015 [employer]. This information is not intended to replace advice given to you by your health care provider. Make sure you discuss any questions you have with your health care provider.  Eleccin del mtodo anticonceptivo (Contraception Choices) La anticoncepcin (control de la natalidad) es el uso de cualquier mtodo o dispositivo para evitar el embarazo. A continuacin se indican algunos de esos mtodos. MTODOS HORMONALES   El Implante contraconceptivo consiste en un tubo plstico delgado que contiene la hormona progesterona. No contiene   estrgenos. El mdico inserta el tubo en la parte interna del brazo. El tubo puede permanecer en el lugar durante 3 aos. Despus de los 3 aos debe retirarse. El implante impide que los ovarios liberen vulos (ovulacin), espesa el moco cervical, lo que evita que los espermatozoides ingresen al tero y hace ms delgada la membrana que cubre el interior del tero.  Inyecciones de progesterona sola: las administra el mdico cada 3 meses para evitar el embarazo. La progesterona sinttica impide que los ovarios liberen vulos. Tambin hacen que el  moco cervical se espese y modifique el tejido de recubrimiento interno del tero. Esto hace ms difcil que los espermatozoides sobrevivan en el tero.  Las pldoras anticonceptivas contienen estrgenos y progesterona. Su funcin es evitar que los ovarios liberen vulos (ovulacin). Las hormonas de los anticonceptivos orales hacen que el moco cervical se haga ms espeso, lo que evita que el esperma ingrese al tero. Las pldoras anticonceptivas son recetadas por el mdico.Tambin se utilizan para tratar los perodos menstruales abundantes.  Minipldora: este tipo de pldora anticonceptiva contiene slo hormona progesterona. Deben tomarse todos los das del mes y debe recetarlas el mdico.  El parche de control de natalidad: contiene hormonas similares a las que contienen las pldoras anticonceptivas. Deben cambiarse una vez por semana y se utilizan bajo prescripcin mdica.  Anillo vaginal: contiene hormonas similares a las que contienen las pldoras anticonceptivas. Se deja colocado durante tres semanas, se lo retira durante 1 semana y luego se coloca uno nuevo. La paciente debe sentirse cmoda al insertar y retirar el anillo de la vagina.Es necesaria la prescripcin mdica.  Anticonceptivos de emergencia: son mtodos para evitar un embarazo despus de una relacin sexual sin proteccin. Esta pldora puede tomarse inmediatamente despus de tener relaciones sexuales o hasta 5 das de haber tenido sexo sin proteccin. Es ms efectiva si se toma poco tiempo despus de la relacin sexual. Los anticonceptivos de emergencia estn disponibles sin prescripcin mdica. Consltelo con su farmacutico. No use los anticonceptivos de emergencia como nico mtodo anticonceptivo. MTODOS DE BARRERA   Condn masculino: es una vaina delgada (ltex o goma) que se coloca cubriendo al pene durante el acto sexual. Puede usarse con espermicida para aumentar la efectividad.  Condn femenino. Es una funda delicada y  blanda que se adapta holgadamente a la vagina antes de las relaciones sexuales.  Diafragma: es una barrera de ltex redonda y suave que debe ser recomendado por un profesional. Se inserta en la vagina, junto con un gel espermicida. Debe insertarse antes de tener relaciones sexuales. Debe dejar el diafragma colocado en la vagina durante 6 a 8 horas despus de la relacin sexual.  Capuchn cervical: es una barrera de ltex o taza plstica redonda y suave que cubre el cuello del tero y debe ser colocada por un mdico. Puede dejarlo colocado en la vagina hasta 48 horas despus de las relaciones sexuales.  Esponja: es una pieza blanda y circular de espuma de poliuretano. Contiene un espermicida. Se inserta en la vagina despus de mojarla y antes de las relaciones sexuales.  Espermicidas: son sustancias qumicas que matan o bloquean al esperma y no lo dejan ingresar al cuello del tero y al tero. Vienen en forma de cremas, geles, supositorios, espuma o comprimidos. No es necesario tener receta mdica. Se insertan en la vagina con un aplicador antes de tener relaciones sexuales. El proceso debe repetirse cada vez que tiene relaciones sexuales. ANTICONCEPTIVOS INTRAUTERINOS  Dispositivo intrauterino (DIU) es un dispositivo en forma de T   que se coloca en el tero durante el perodo menstrual, para evitar el embarazo. Hay dos tipos:  DIU de cobre: este tipo de DIU est recubierto con un alambre de cobre y se inserta dentro del tero. El cobre hace que el tero y las trompas de Falopio produzcan un liquido que destruye los espermatozoides. Puede permanecer colocado durante 10 aos.  DIU con hormona: este tipo de DIU contiene la hormona progestina (progesterona sinttica). La hormona espesa el moco cervical y evita que los espermatozoides ingresen al tero y tambin afina la membrana que cubre el tero para evitar la implantacin del vulo fertilizado. La hormona debilita o destruye los espermatozoides que  ingresan al tero. Puede permanecer en el lugar durante 3-5 aos, segn el tipo de DIU que se utilice. MTODOS ANTICONCEPTIVOS PERMANENTES  Ligadura de trompas en la mujer: se realiza sellando, atando u obstruyendo quirrgicamente las trompas de Falopio lo que impide que el vulo descienda hacia el tero.  Esterilizacin histeroscpica: Implica la colocacin de un pequeo espiral o la insercin en cada trompa de Falopio. El mdico utiliza una tcnica llamada histeroscopa para realizar este procedimiento. El dispositivo produce la formacin de tejido cicatrizal. Esto da como resultado una obstruccin permanente de las trompas de Falopio, de modo que la esperma no pueda fertilizar el vulo. Demora alrededor de 3 meses despus del procedimiento hasta que el conducto se obstruye. Tendr que usar otro mtodo anticonceptivo durante al menos 3 meses.  Esterilizacin masculina: se realiza ligando los conductos por los que pasan los espermatozoides (vasectoma).Esto impide que el esperma ingrese a la vagina durante el acto sexual. Luego del procedimiento, el hombre puede eyacular lquido (semen). MTODOS DE PLANIFICACIN NATURAL  Planificacin familiar natural: consiste en no tener relaciones sexuales o usar un mtodo de barrera (condn, diafragma, capuchn cervical) en los das que la mujer podra quedar embarazada.  Mtodo de calendario: consiste en el seguimiento de la duracin de cada ciclo menstrual y la identificacin de los perodos frtiles.  Mtodo de ovulacin: consiste en evitar las relaciones sexuales durante la ovulacin.  Mtodo sintotrmico: consiste en evitar las relaciones sexuales en la poca en la que se est ovulando, utilizando un termmetro y tendiendo en cuenta los sntomas de la ovulacin.  Mtodo postovulacin: consiste en planificar las relaciones sexuales para despus de haber ovulado. Independientemente del tipo o mtodo anticonceptivo que usted elija, es importante que use  condones para protegerse contra las infecciones de transmisin sexual (ETS). Hable con su mdico con respecto a qu mtodo anticonceptivo es el ms apropiado para usted. Document Released: 08/24/2005 Document Revised: 04/26/2013 [employer] Patient Information 2015 [employer]. This information is not intended to replace advice given to you by your health care provider. Make sure you discuss any questions you have with your health care provider.  Lactancia materna (Breastfeeding) Decidir amamantar es una de las mejores elecciones que puede hacer por usted y su beb. El cambio hormonal durante el embarazo produce el desarrollo del tejido mamario y aumenta la cantidad y el tamao de los conductos galactforos. Estas hormonas tambin permiten que las protenas, los azcares y las grasas de la sangre produzcan la leche materna en las glndulas productoras de leche. Las hormonas impiden que la leche materna sea liberada antes del nacimiento del beb, adems de impulsar el flujo de leche luego del nacimiento. Una vez que ha comenzado a amamantar, pensar en el beb, as como la succin o el llanto, pueden estimular la liberacin de leche de las glndulas productoras de leche.    LOS BENEFICIOS DE AMAMANTAR Para el beb  La primera leche (calostro) ayuda a mejorar el funcionamiento del sistema digestivo del beb.  La leche tiene anticuerpos que ayudan a prevenir las infecciones en el beb.  El beb tiene una menor incidencia de asma, alergias y del sndrome de muerte sbita del lactante.  Los nutrientes en la leche materna son mejores para el beb que la leche maternizada y estn preparados exclusivamente para cubrir las necesidades del beb.  La leche materna mejora el desarrollo cerebral del beb.  Es menos probable que el beb desarrolle otras enfermedades, como obesidad infantil, asma o diabetes mellitus de tipo 2. Para usted   La lactancia materna favorece el desarrollo de un vnculo muy especial  entre la madre y el beb.  Es conveniente. La leche materna siempre est disponible a la temperatura correcta y es econmica.  La lactancia materna ayuda a quemar caloras y a perder el peso ganado durante el embarazo.  Favorece la contraccin del tero al tamao que tena antes del embarazo de manera ms rpida y disminuye el sangrado (loquios) despus del parto.  La lactancia materna contribuye a reducir el riesgo de desarrollar diabetes mellitus de tipo 2, osteoporosis o cncer de mama o de ovario en el futuro. SIGNOS DE QUE EL BEB EST HAMBRIENTO Primeros signos de hambre  Aumenta su estado de alerta o actividad.  Se estira.  Mueve la cabeza de un lado a otro.  Mueve la cabeza y abre la boca cuando se le toca la mejilla o la comisura de la boca (reflejo de bsqueda).  Aumenta las vocalizaciones, tales como sonidos de succin, se relame los labios, emite arrullos, suspiros, o chirridos.  Mueve la mano hacia la boca.  Se chupa con ganas los dedos o las manos. Signos tardos de hambre  Est agitado.  Llora de manera intermitente. Signos de hambre extrema Los signos de hambre extrema requerirn que lo calme y lo consuele antes de que el beb pueda alimentarse adecuadamente. No espere a que se manifiesten los siguientes signos de hambre extrema para comenzar a amamantar:   Agitacin.  Llanto intenso y fuerte.   Gritos. INFORMACIN BSICA SOBRE LA LACTANCIA MATERNA Iniciacin de la lactancia materna  Encuentre un lugar cmodo para sentarse o acostarse, con un buen respaldo para el cuello y la espalda.  Coloque una almohada o una manta enrollada debajo del beb para acomodarlo a la altura de la mama (si est sentada). Las almohadas para amamantar se han diseado especialmente a fin de servir de apoyo para los brazos y el beb mientras amamanta.  Asegrese de que el abdomen del beb est frente al suyo.  Masajee suavemente la mama. Con las yemas de los dedos, masajee la  pared del pecho hacia el pezn en un movimiento circular. Esto estimula el flujo de leche. Es posible que deba continuar este movimiento mientras amamanta si la leche fluye lentamente.  Sostenga la mama con el pulgar por arriba del pezn y los otros 4 dedos por debajo de la mama. Asegrese de que los dedos se encuentren lejos del pezn y de la boca del beb.  Empuje suavemente los labios del beb con el pezn o con el dedo.  Cuando la boca del beb se abra lo suficiente, acrquelo rpidamente a la mama e introduzca todo el pezn y la zona oscura que lo rodea (areola), tanto como sea posible, dentro de la boca del beb.  Debe haber ms areola visible por arriba del labio superior del   beb que por debajo del labio inferior.  La lengua del beb debe estar entre la enca inferior y la mama.  Asegrese de que la boca del beb est en la posicin correcta alrededor del pezn (prendida). Los labios del beb deben crear un sello sobre la mama y estar doblados hacia afuera (invertidos).  Es comn que el beb succione durante 2 a 3 minutos para que comience el flujo de leche materna. Cmo debe prenderse Es muy importante que le ensee al beb cmo prenderse adecuadamente a la mama. Si el beb no se prende adecuadamente, puede causarle dolor en el pezn y reducir la produccin de leche materna, y hacer que el beb tenga un escaso aumento de peso. Adems, si el beb no se prende adecuadamente al pezn, puede tragar aire durante la alimentacin. Esto puede causarle molestias al beb. Hacer eructar al beb al cambiar de mama puede ayudarlo a liberar el aire. Sin embargo, ensearle al beb cmo prenderse a la mama adecuadamente es la mejor manera de evitar que se sienta molesto por tragar aire mientras se alimenta. Signos de que el beb se ha prendido adecuadamente al pezn:   Tironea o succiona de modo silencioso, sin causarle dolor.  Se escucha que traga cada 3 o 4 succiones.   Hay movimientos musculares  por arriba y por delante de sus odos al succionar. Signos de que el beb no se ha prendido adecuadamente al pezn:   Hace ruidos de succin o de chasquido mientras se alimenta.  Siente dolor en el pezn. Si cree que el beb no se prendi correctamente, deslice el dedo en la comisura de la boca y colquelo entre las encas del beb para interrumpir la succin. Intente comenzar a amamantar nuevamente. Signos de lactancia materna exitosa Signos del beb:   Disminuye gradualmente el nmero de succiones o cesa la succin por completo.  Se duerme.  Relaja el cuerpo.  Retiene una pequea cantidad de leche en la boca.  Se desprende solo del pecho. Signos que presenta usted:  Las mamas han aumentado la firmeza, el peso y el tamao 1 a 3 horas despus de amamantar.  Estn ms blandas inmediatamente despus de amamantar.  Un aumento del volumen de leche, y tambin un cambio en su consistencia y color se producen hacia el quinto da de lactancia materna.  Los pezones no duelen, ni estn agrietados ni sangran. Signos de que su beb recibe la cantidad de leche suficiente  Moja al menos 3 paales en 24 horas. La orina debe ser clara y de color amarillo plido a los 5 das de vida.  Defeca al menos 3 veces en 24 horas a los 5 das de vida. La materia fecal debe ser blanda y amarillenta.  Defeca al menos 3 veces en 24 horas a los 7 das de vida. La materia fecal debe ser grumosa y amarillenta.  No registra una prdida de peso mayor del 10% del peso al nacer durante los primeros 3 das de vida.  Aumenta de peso un promedio de 4 a 7onzas (113 a 198g) por semana despus de los 4 das de vida.  Aumenta de peso, diariamente, de manera uniforme a partir de los 5 das de vida, sin registrar prdida de peso despus de las 2semanas de vida. Despus de alimentarse, es posible que el beb regurgite una pequea cantidad. Esto es frecuente. FRECUENCIA Y DURACIN DE LA LACTANCIA MATERNA El  amamantamiento frecuente la ayudar a producir ms leche y a prevenir problemas de dolor en   los pezones e hinchazn en las mamas. Alimente al beb cuando muestre signos de hambre o si siente la necesidad de reducir la congestin de las mamas. Esto se denomina "lactancia a demanda". Evite el uso del chupete mientras trabaja para establecer la lactancia (las primeras 4 a 6 semanas despus del nacimiento del beb). Despus de este perodo, podr ofrecerle un chupete. Las investigaciones demostraron que el uso del chupete durante el primer ao de vida del beb disminuye el riesgo de desarrollar el sndrome de muerte sbita del lactante (SMSL). Permita que el nio se alimente en cada mama todo lo que desee. Contine amamantando al beb hasta que haya terminado de alimentarse. Cuando el beb se desprende o se queda dormido mientras se est alimentando de la primera mama, ofrzcale la segunda. Debido a que, con frecuencia, los recin nacidos permanecen somnolientos las primeras semanas de vida, es posible que deba despertar al beb para alimentarlo. Los horarios de lactancia varan de un beb a otro. Sin embargo, las siguientes reglas pueden servir como gua para ayudarla a garantizar que el beb se alimenta adecuadamente:  Se puede amamantar a los recin nacidos (bebs de 4 semanas o menos de vida) cada 1 a 3 horas.  No deben transcurrir ms de 3 horas durante el da o 5 horas durante la noche sin que se amamante a los recin nacidos.  Debe amamantar al beb 8 veces como mnimo en un perodo de 24 horas, hasta que comience a introducir slidos en su dieta, a los 6 meses de vida aproximadamente. EXTRACCIN DE LECHE MATERNA La extraccin y el almacenamiento de la leche materna le permiten asegurarse de que el beb se alimente exclusivamente de leche materna, aun en momentos en los que no puede amamantar. Esto tiene especial importancia si debe regresar al trabajo en el perodo en que an est amamantando o si no  puede estar presente en los momentos en que el beb debe alimentarse. Su asesor en lactancia puede orientarla sobre cunto tiempo es seguro almacenar leche materna.  El sacaleche es un aparato que le permite extraer leche de la mama a un recipiente estril. Luego, la leche materna extrada puede almacenarse en un refrigerador o congelador. Algunos sacaleches son manuales, mientras que otros son elctricos. Consulte a su asesor en lactancia qu tipo ser ms conveniente para usted. Los sacaleches se pueden comprar; sin embargo, algunos hospitales y grupos de apoyo a la lactancia materna alquilan sacaleches mensualmente. Un asesor en lactancia puede ensearle cmo extraer leche materna manualmente, en caso de que prefiera no usar un sacaleche.  CMO CUIDAR LAS MAMAS DURANTE LA LACTANCIA MATERNA Los pezones se secan, agrietan y duelen durante la lactancia materna. Las siguientes recomendaciones pueden ayudarla a mantener las mamas humectadas y sanas:  Evite usar jabn en los pezones.  Use un sostn de soporte. Aunque no son esenciales, las camisetas sin mangas o los sostenes especiales para amamantar estn diseados para acceder fcilmente a las mamas, para amamantar sin tener que quitarse todo el sostn o la camiseta. Evite usar sostenes con aro o sostenes muy ajustados.  Seque al aire sus pezones durante 3 a 4minutos despus de amamantar al beb.  Utilice solo apsitos de algodn en el sostn para absorber las prdidas de leche. La prdida de un poco de leche materna entre las tomas es normal.  Utilice lanolina sobre los pezones luego de amamantar. La lanolina ayuda a mantener la humedad normal de la piel. Si usa lanolina pura, no tiene que lavarse los pezones   antes de volver a alimentar al beb. La lanolina pura no es txica para el beb. Adems, puede extraer manualmente algunas gotas de leche materna y masajear suavemente esa leche sobre los pezones, para que la leche se seque al aire. Durante las  primeras semanas despus de dar a luz, algunas mujeres pueden experimentar hinchazn en las mamas (congestin mamaria). La congestin puede hacer que sienta las mamas pesadas, calientes y sensibles al tacto. El pico de la congestin ocurre dentro de los 3 a 5 das despus del parto. Las siguientes recomendaciones pueden ayudarla a aliviar la congestin:  Vace por completo las mamas al amamantar o extraer leche. Puede aplicar calor hmedo en las mamas (en la ducha o con toallas hmedas para manos) antes de amamantar o extraer leche. Esto aumenta la circulacin y ayuda a que la leche fluya. Si el beb no vaca por completo las mamas cuando lo amamanta, extraiga la leche restante despus de que haya finalizado.  Use un sostn ajustado (para amamantar o comn) o una camiseta sin mangas durante 1 o 2 das para indicar al cuerpo que disminuya ligeramente la produccin de leche.  Aplique compresas de hielo sobre las mamas, a menos que le resulte demasiado incmodo.  Asegrese de que el beb est prendido y se encuentre en la posicin correcta mientras lo alimenta. Si la congestin persiste luego de 48 horas o despus de seguir estas recomendaciones, comunquese con su mdico o un asesor en lactancia. RECOMENDACIONES GENERALES PARA EL CUIDADO DE LA SALUD DURANTE LA LACTANCIA MATERNA  Consuma alimentos saludables. Alterne comidas y colaciones, y coma 3 de cada una por da. Dado que lo que come afecta la leche materna, es posible que algunas comidas hagan que su beb se vuelva ms irritable de lo habitual. Evite comer este tipo de alimentos si percibe que afectan de manera negativa al beb.  Beba leche, jugos de fruta y agua para satisfacer su sed (aproximadamente 10 vasos al da).  Descanse con frecuencia, reljese y tome sus vitaminas prenatales para evitar la fatiga, el estrs y la anemia.  Contine con los autocontroles de la mama.  Evite masticar y fumar tabaco.  Evite el consumo de alcohol y  drogas. Algunos medicamentos, que pueden ser perjudiciales para el beb, pueden pasar a travs de la leche materna. Es importante que consulte a su mdico antes de tomar cualquier medicamento, incluidos todos los medicamentos recetados y de venta libre, as como los suplementos vitamnicos y herbales. Puede quedar embarazada durante la lactancia. Si desea controlar la natalidad, consulte a su mdico cules son las opciones ms seguras para el beb. SOLICITE ATENCIN MDICA SI:   Usted siente que quiere dejar de amamantar o se siente frustrada con la lactancia.  Siente dolor en las mamas o en los pezones.  Sus pezones estn agrietados o sangran.  Sus pechos estn irritados, sensibles o calientes.  Tiene un rea hinchada en cualquiera de las mamas.  Siente escalofros o fiebre.  Tiene nuseas o vmitos.  Presenta una secrecin de otro lquido distinto de la leche materna de los pezones.  Sus mamas no se llenan antes de amamantar al beb para el quinto da despus del parto.  Se siente triste y deprimida.  El beb est demasiado somnoliento como para comer bien.  El beb tiene problemas para dormir.  Moja menos de 3 paales en 24 horas.  Defeca menos de 3 veces en 24 horas.  La piel del beb o la parte blanca de los ojos se vuelven   amarillentas.  El beb no ha aumentado de peso a los 5 das de vida. SOLICITE ATENCIN MDICA DE INMEDIATO SI:   El beb est muy cansado (letargo) y no se quiere despertar para comer.  Le sube la fiebre sin causa. Document Released: 08/24/2005 Document Revised: 08/29/2013 ExitCare Patient Information 2015 [employer]. This information is not intended to replace advice given to you by your health care provider. Make sure you discuss any questions you have with your health care provider.  

## 2014-03-08 NOTE — Progress Notes (Signed)
   Subjective:    Alexis Marsh is a [id number] being seen today for her first obstetrical visit.  Her obstetrical history is significant for previous preterm delivery x2 (used 17-P with last pregnancy), late onset of prenatal care, previous infant with congenital anomaly. Patient does intend to breast feed. Pregnancy history fully reviewed.  Patient reports no complaints.  Filed Vitals:   03/08/14 0847  BP: 115/69  Pulse: 96  Weight: 147 lb 1.6 oz (66.724 kg)    HISTORY: OB History  Gravida Para Term Preterm AB SAB TAB Ectopic Multiple Living  3 2  2      2     # Outcome Date GA Lbr Len/2nd Weight Sex Delivery Anes PTL Lv  3 CUR           2 PRE 06/13/13 2151w2d 03:22 / 00:02 4 lb 11.6 oz (2.143 kg) M SVD None  Y     Comments: PROM  1 PRE 06/14/08 258w0d  5 lb 3 oz (2.353 kg) M SVD None  Y     Comments: PTL, PTD , [place], had SOB during pregnancy,had cleft palate, right ear and face deformity, missing left thumb     Past Medical History  Diagnosis Date  . Preterm labor    Past Surgical History  Procedure Laterality Date  . No past surgeries     Family History  Problem Relation Age of Onset  . Hypertension Mother      Exam    Uterus:     Pelvic Exam:    Perineum: No Hemorrhoids, Normal Perineum   Vulva: normal   Vagina:  normal mucosa, normal discharge   pH:    Cervix: closed and long   Adnexa: not evaluated   Bony Pelvis: android  System: Breast:  normal appearance, no masses or tenderness   Skin: normal coloration and turgor, no rashes    Neurologic: oriented, no focal deficits   Extremities: normal strength, tone, and muscle mass   HEENT extra ocular movement intact   Mouth/Teeth mucous membranes moist, pharynx normal without lesions and dental hygiene good   Neck supple and no masses   Cardiovascular: regular rate and rhythm   Respiratory:  chest clear, no wheezing, crepitations, rhonchi, normal symmetric air entry   Abdomen:  soft, gravid   Urinary:       Assessment:    Pregnancy: [id number] Patient Active Problem List   Diagnosis Date Noted  . Supervision of high-risk pregnancy 03/08/2014  . History of preterm delivery, currently pregnant 03/07/2014  . Late prenatal care 03/07/2014  . Language barrier 03/07/2014  . 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 Problem list reviewed and updated. Genetic Screening discussed Quad Screen: results reviewed.  Ultrasound discussed; fetal survey: results reviewed. Patient desires to try 17-P again, eventhough she feels it did not help her last time  Follow up in 4 weeks. Will start 17-P as soon as it arrives 50% of 30 min visit spent on counseling and coordination of care.     [name] 03/08/2014

## 2014-04-05 ENCOUNTER — Ambulatory Visit (INDEPENDENT_AMBULATORY_CARE_PROVIDER_SITE_OTHER): Payer: Self-pay | Admitting: Family

## 2014-04-05 VITALS — BP 99/58 | HR 85 | Temp 98.3°F | Wt 149.8 lb

## 2014-04-05 DIAGNOSIS — O0992 Supervision of high risk pregnancy, unspecified, second trimester: Secondary | ICD-10-CM

## 2014-04-05 DIAGNOSIS — O09219 Supervision of pregnancy with history of pre-term labor, unspecified trimester: Secondary | ICD-10-CM

## 2014-04-05 LAB — POCT URINALYSIS DIP (DEVICE)
BILIRUBIN URINE: NEGATIVE
Glucose, UA: NEGATIVE mg/dL
Hgb urine dipstick: NEGATIVE
Ketones, ur: NEGATIVE mg/dL
NITRITE: NEGATIVE
Protein, ur: NEGATIVE mg/dL
Specific Gravity, Urine: 1.015 (ref 1.005–1.030)
Urobilinogen, UA: 0.2 mg/dL (ref 0.0–1.0)
pH: 7 (ref 5.0–8.0)

## 2014-04-05 NOTE — Progress Notes (Signed)
Pt scheduled for follow up u/s for growth 04/06/14 @ 4pm.  Pt verbalized understanding, interpreter confirmed with pt.   Everlean PattersonA. Mariateresa Batra, RNC

## 2014-04-05 NOTE — Progress Notes (Signed)
Reviewed lab and ultrasound results.  Mother reports concern due to feeling decreased movement compared to other babies.  Provided reassurance, ordered growth ultrasound due to patient concern and history of previous baby with genetic problems (ease maternal concern).  17p today.

## 2014-04-06 ENCOUNTER — Ambulatory Visit (HOSPITAL_COMMUNITY)
Admission: RE | Admit: 2014-04-06 | Discharge: 2014-04-06 | Disposition: A | Discharge status: Home / Self Care | Payer: Self-pay | Source: Ambulatory Visit | Attending: Advanced Practice Midwife | Admitting: Advanced Practice Midwife

## 2014-04-06 DIAGNOSIS — O09899 Supervision of other high risk pregnancies, unspecified trimester: Secondary | ICD-10-CM | POA: Insufficient documentation

## 2014-04-06 DIAGNOSIS — O0992 Supervision of high risk pregnancy, unspecified, second trimester: Secondary | ICD-10-CM

## 2014-04-06 DIAGNOSIS — Z3689 Encounter for other specified antenatal screening: Secondary | ICD-10-CM | POA: Insufficient documentation

## 2014-04-09 ENCOUNTER — Telehealth: Payer: Self-pay

## 2014-04-09 NOTE — Telephone Encounter (Signed)
Received call from [facility] regarding delivery of Makena. Called [phone] and confirmed need for medication and overnight delivery-- to be shipped tonight and arrive in clinic 04/10/14.

## 2014-04-12 ENCOUNTER — Ambulatory Visit (INDEPENDENT_AMBULATORY_CARE_PROVIDER_SITE_OTHER): Payer: Self-pay | Admitting: *Deleted

## 2014-04-12 VITALS — BP 99/61 | HR 98 | Temp 98.0°F | Wt 151.8 lb

## 2014-04-12 DIAGNOSIS — O09219 Supervision of pregnancy with history of pre-term labor, unspecified trimester: Secondary | ICD-10-CM

## 2014-04-12 DIAGNOSIS — O09892 Supervision of other high risk pregnancies, second trimester: Secondary | ICD-10-CM

## 2014-04-12 DIAGNOSIS — O09212 Supervision of pregnancy with history of pre-term labor, second trimester: Principal | ICD-10-CM

## 2014-04-12 NOTE — Addendum Note (Signed)
Addended by: Candelaria StagersHAIZLIP, Morgane Joerger E on: 04/12/2014 11:29 AM   Modules accepted: Level of Service

## 2014-04-19 ENCOUNTER — Ambulatory Visit (INDEPENDENT_AMBULATORY_CARE_PROVIDER_SITE_OTHER): Payer: Self-pay | Admitting: Obstetrics & Gynecology

## 2014-04-19 VITALS — BP 109/64 | HR 98 | Temp 98.3°F | Wt 153.3 lb

## 2014-04-19 DIAGNOSIS — O09213 Supervision of pregnancy with history of pre-term labor, third trimester: Principal | ICD-10-CM

## 2014-04-19 DIAGNOSIS — O09893 Supervision of other high risk pregnancies, third trimester: Secondary | ICD-10-CM

## 2014-04-19 DIAGNOSIS — O09219 Supervision of pregnancy with history of pre-term labor, unspecified trimester: Secondary | ICD-10-CM

## 2014-04-19 DIAGNOSIS — Z23 Encounter for immunization: Secondary | ICD-10-CM

## 2014-04-19 LAB — CBC
HCT: 34.8 % — ABNORMAL LOW (ref 36.0–46.0)
HEMOGLOBIN: 11.9 g/dL — AB (ref 12.0–15.0)
MCH: 28.8 pg (ref 26.0–34.0)
MCHC: 34.2 g/dL (ref 30.0–36.0)
MCV: 84.3 fL (ref 78.0–100.0)
PLATELETS: 190 10*3/uL (ref 150–400)
RBC: 4.13 MIL/uL (ref 3.87–5.11)
RDW: 14 % (ref 11.5–15.5)
WBC: 7.9 10*3/uL (ref 4.0–10.5)

## 2014-04-19 LAB — POCT URINALYSIS DIP (DEVICE)
Bilirubin Urine: NEGATIVE
GLUCOSE, UA: NEGATIVE mg/dL
Ketones, ur: NEGATIVE mg/dL
Nitrite: NEGATIVE
Protein, ur: NEGATIVE mg/dL
Specific Gravity, Urine: 1.02 (ref 1.005–1.030)
Urobilinogen, UA: 0.2 mg/dL (ref 0.0–1.0)
pH: 6 (ref 5.0–8.0)

## 2014-04-19 MED ORDER — FLUCONAZOLE 150 MG PO TABS: 150.0000 mg | ORAL_TABLET | Freq: Once | ORAL | Status: DC

## 2014-04-19 MED ORDER — TETANUS-DIPHTH-ACELL PERTUSSIS 5-2.5-18.5 LF-MCG/0.5 IM SUSP
0.5000 mL | Freq: Once | INTRAMUSCULAR | Status: AC
  Administered 2014-04-19: 0.5 mL via INTRAMUSCULAR

## 2014-04-19 NOTE — Patient Instructions (Signed)
Informacin sobre el parto prematuro  (Preterm Labor Information) El parto prematuro comienza antes de la semana 37 de embarazo. La duracin de un embarazo normal es de 39 a 41 semanas.  CAUSAS  Generalmente no hay una causa que pueda identificarse del motivo por el que una mujer comienza un trabajo de parto prematuro. Sin embargo, una de las causas conocidas ms frecuentes son las infecciones. Las infecciones del tero, el cuello, la vagina, el lquido amnitico, la vejiga, los riones y hasta de los pulmones (neumona) pueden hacer que el trabajo de parto se inicie. Otras causas que pueden sospecharse son:   Infecciones urogenitales, como infecciones por hongos y vaginosis bacteriana.   Anormalidades uterinas (forma del tero, sptum uterino, fibromas, hemorragias en la placenta).   Un cuello que ha sido operado (puede ser que no permanezca cerrado).   Malformaciones del feto.   Gestaciones mltiples (mellizos, trillizos y ms).   Ruptura del saco amnitico.  FACTORES DE RIESGO   Historia previa de parto prematuro.   Tener ruptura prematura de las membranas (RPM).   La placenta cubre la abertura del cuello (placenta previa).   La placenta se separa del tero (abrupcin placentaria).   El cuello es demasiado dbil para contener al beb en el tero (cuello incompetente).   Hay mucho lquido en el saco amnitico (polihidramnios).   Consumo de drogas o hbito de fumar durante el embarazo.   No aumentar de peso lo suficiente durante el embarazo.   Mujeres menores de 18 aos o mayores de 35 aos.   Nivel socioeconmico bajo.   Pertenecer a la raza afroamericana. SNTOMAS  Los signos y sntomas del trabajo de parto prematuro son:   Clicos similares a los menstruales, dolor abdominal o dolor de espalda.  Contracciones uterinas regulares, tan frecuentes como seis por hora, sin importar su intensidad (pueden ser suaves o dolorosas).  Contracciones que comienzan  en la parte superior del tero y se expanden hacia abajo, a la zona inferior del abdomen y la espalda.   Sensacin de aumento de presin en la pelvis.   Aparece una secrecin acuosa o sanguinolenta por la vagina.  TRATAMIENTO  Segn el tiempo del embarazo y otras circunstancias, el mdico puede indicar reposo en cama. Si es necesario, le indicarn medicamentos para detener las contracciones y para madurar los pulmones del feto. Si el trabajo de parto se inicia antes de las 34 semanas de embarazo, se recomienda la hospitalizacin. El tratamiento depende de las condiciones en que se encuentren usted y el feto.  QU DEBE HACER SI PIENSA QUE EST EN TRABAJO DE PARTO PREMATURO?  Comunquese con su mdico inmediatamente. Debe concurrir al hospital para ser controlada inmediatamente.  CMO PUEDE EVITAR EL TRABAJO DE PARTO PREMATURO EN FUTUROS EMBARAZOS?  Usted debe:   Si fuma, abandonar el hbito.  Mantener un peso saludable y evitar sustancias qumicas y drogas innecesarias.  Controlar todo tipo de infeccin.  Informe a su mdico si tiene una historia conocida de trabajo de parto prematuro. Document Released: 12/01/2007 Document Revised: 04/26/2013 ExitCare Patient Information 2015 [employer]. This information is not intended to replace advice given to you by your health care provider. Make sure you discuss any questions you have with your health care provider.  

## 2014-04-19 NOTE — Progress Notes (Signed)
Wet prep, exam c/w yeast, rx diflucan. Nl [place] on 7/31

## 2014-04-19 NOTE — Progress Notes (Signed)
Patient reports white d/c with itching

## 2014-04-20 LAB — WET PREP, GENITAL
TRICH WET PREP: NONE SEEN
Yeast Wet Prep HPF POC: NONE SEEN

## 2014-04-20 LAB — GLUCOSE TOLERANCE, 1 HOUR (50G) W/O FASTING: Glucose, 1 Hour GTT: 109 mg/dL (ref 70–140)

## 2014-04-20 LAB — RPR

## 2014-04-20 LAB — HIV ANTIBODY (ROUTINE TESTING W REFLEX): HIV: NONREACTIVE

## 2014-04-26 ENCOUNTER — Ambulatory Visit (INDEPENDENT_AMBULATORY_CARE_PROVIDER_SITE_OTHER): Payer: Self-pay | Admitting: General Practice

## 2014-04-26 VITALS — BP 99/60 | HR 91 | Temp 98.6°F | Ht 59.0 in | Wt 153.6 lb

## 2014-04-26 DIAGNOSIS — O09893 Supervision of other high risk pregnancies, third trimester: Secondary | ICD-10-CM

## 2014-04-26 DIAGNOSIS — O09213 Supervision of pregnancy with history of pre-term labor, third trimester: Principal | ICD-10-CM

## 2014-04-26 DIAGNOSIS — O09219 Supervision of pregnancy with history of pre-term labor, unspecified trimester: Secondary | ICD-10-CM

## 2014-05-02 ENCOUNTER — Telehealth: Payer: Self-pay

## 2014-05-02 ENCOUNTER — Encounter: Payer: Self-pay | Admitting: General Practice

## 2014-05-02 DIAGNOSIS — B9689 Other specified bacterial agents as the cause of diseases classified elsewhere: Secondary | ICD-10-CM

## 2014-05-02 DIAGNOSIS — N760 Acute vaginitis: Principal | ICD-10-CM

## 2014-05-02 MED ORDER — METRONIDAZOLE 500 MG PO TABS: 500.0000 mg | ORAL_TABLET | Freq: Two times a day (BID) | ORAL | Status: DC

## 2014-05-02 NOTE — Telephone Encounter (Signed)
BV found on wet prep at visit on 04/19/14. Had suspicion of yeast at visit and RX for Diflucan given to patient. Flagyl  BID X7 days e-prescribed per protocol. Patient needs to be called and informed.

## 2014-05-02 NOTE — Telephone Encounter (Signed)
Note made on tomorrows visit to inform patient of bv and to pick up rx.

## 2014-05-03 ENCOUNTER — Ambulatory Visit (INDEPENDENT_AMBULATORY_CARE_PROVIDER_SITE_OTHER): Payer: Self-pay | Admitting: Obstetrics & Gynecology

## 2014-05-03 VITALS — BP 115/60 | HR 97 | Temp 98.3°F | Wt 154.2 lb

## 2014-05-03 DIAGNOSIS — O09213 Supervision of pregnancy with history of pre-term labor, third trimester: Principal | ICD-10-CM

## 2014-05-03 DIAGNOSIS — O0993 Supervision of high risk pregnancy, unspecified, third trimester: Secondary | ICD-10-CM

## 2014-05-03 DIAGNOSIS — O0990 Supervision of high risk pregnancy, unspecified, unspecified trimester: Secondary | ICD-10-CM

## 2014-05-03 DIAGNOSIS — O0930 Supervision of pregnancy with insufficient antenatal care, unspecified trimester: Secondary | ICD-10-CM

## 2014-05-03 DIAGNOSIS — O0933 Supervision of pregnancy with insufficient antenatal care, third trimester: Secondary | ICD-10-CM

## 2014-05-03 DIAGNOSIS — Z789 Other specified health status: Secondary | ICD-10-CM

## 2014-05-03 DIAGNOSIS — O09893 Supervision of other high risk pregnancies, third trimester: Secondary | ICD-10-CM

## 2014-05-03 DIAGNOSIS — O09219 Supervision of pregnancy with history of pre-term labor, unspecified trimester: Secondary | ICD-10-CM

## 2014-05-03 LAB — POCT URINALYSIS DIP (DEVICE)
Bilirubin Urine: NEGATIVE
Glucose, UA: 100 mg/dL — AB
Hgb urine dipstick: NEGATIVE
KETONES UR: NEGATIVE mg/dL
NITRITE: NEGATIVE
PH: 6.5 (ref 5.0–8.0)
PROTEIN: NEGATIVE mg/dL
Specific Gravity, Urine: <=1.005 (ref 1.005–1.030)
Urobilinogen, UA: 0.2 mg/dL (ref 0.0–1.0)

## 2014-05-03 NOTE — Progress Notes (Signed)
Patient is Spanish-speaking only, Spanish interpreter present for this encounter. Contraception counseling done with patient, information given to patient Continue weekly 17P. No other complaints or concerns.  Labor and fetal movement precautions reviewed.

## 2014-05-03 NOTE — Progress Notes (Signed)
Informed patient of metronidazole sent to pharmacy for BV.

## 2014-05-03 NOTE — Patient Instructions (Signed)
Regrese a la clinica cuando tenga su cita. Si tiene problemas o preguntas, llama a la clinica o vaya a la sala de emergencia al [facility]. Eleccin del mtodo anticonceptivo (Contraception Choices) La anticoncepcin (control de la natalidad) es el uso de cualquier mtodo o dispositivo para Location managerevitar el embarazo. A continuacin se indican algunos de esos mtodos. MTODOS HORMONALES   El Implante contraconceptivo consiste en un tubo plstico delgado que contiene la hormona progesterona. No contiene estrgenos. El mdico inserta el tubo en la parte interna del brazo. El tubo puede Geneticist, molecularpermanecer en el lugar durante 3 aos. Despus de los 3 aos debe retirarse. El implante impide que los ovarios liberen vulos (ovulacin), espesa el moco cervical, lo que evita que los espermatozoides ingresen al tero y hace ms delgada la membrana que cubre el interior del tero.  Inyecciones de progesterona sola: las Insurance underwriteradministra el mdico cada 3 meses para Location managerevitar el embarazo. La progesterona sinttica impide que los ovarios liberen vulos. Tambin hacen que el moco cervical se espese y modifique el tejido de recubrimiento interno del tero. Esto hace ms difcil que los espermatozoides sobrevivan en el tero.  Las pldoras anticonceptivas contienen estrgenos y Education officer, museumprogesterona. Su funcin es ALLTEL Corporationevitar que los ovarios liberen vulos (ovulacin). Las hormonas de los anticonceptivos orales hacen que el moco cervical se haga ms espeso, lo que evita que el esperma ingrese al tero. Las pldoras anticonceptivas son recetadas por el mdico.Tambin se utilizan para tratar los perodos menstruales abundantes.  Minipldora: este tipo de pldora anticonceptiva contiene slo hormona progesterona. Deben tomarse todos los [address]  [address] del mes y debe recetarlas el mdico.  El parche de control de natalidad: contiene hormonas similares a las que contienen las pldoras anticonceptivas. Deben cambiarse una vez por semana y se utilizan bajo  prescripcin mdica.  Anillo vaginal: contiene hormonas similares a las que contienen las pldoras anticonceptivas. Se deja colocado durante tres semanas, se lo retira durante 1 semana y luego se coloca uno nuevo. La paciente debe sentirse cmoda al insertar y retirar el anillo de la vagina.Es necesaria la prescripcin mdica.  Anticonceptivos de emergencia: son mtodos para evitar un embarazo despus de Neomia Dearuna relacin sexual sin proteccin. Esta pldora puede tomarse inmediatamente despus de Child psychotherapisttener relaciones sexuales o hasta 5 [place] Fall[place]as de haber tenido sexo sin proteccin. Es ms efectiva si se toma poco tiempo despus de la relacin sexual. Los anticonceptivos de emergencia estn disponibles sin prescripcin mdica. Consltelo con su farmacutico. No use los anticonceptivos de emergencia como nico mtodo anticonceptivo. MTODOS DE Lenis NoonBARRERA   Condn masculino: es una vaina delgada (ltex o goma) que se coloca cubriendo al pene durante el acto sexual. Deri Fuellinguede usarse con espermicida para aumentar la efectividad.  Condn femenino. Es una funda delicada y blanda que se adapta holgadamente a la vagina antes de las Clinical research associaterelaciones sexuales.  Diafragma: es una barrera de ltex redonda y suave que debe ser recomendado por un profesional. Se inserta en la vagina, junto con un gel espermicida. Debe insertarse antes de Management consultanttener relaciones sexuales. Debe dejar el diafragma colocado en la vagina durante 6 a 8 horas despus de la relacin sexual.  Capuchn cervical: es una barrera de ltex o taza plstica redonda y Bahamassuave que cubre el cuello del tero y debe ser colocada por un mdico. Puede dejarlo colocado en la vagina hasta 48 horas despus de las Clinical research associaterelaciones sexuales.  Esponja: es una pieza blanda y circular de espuma de poliuretano. Contiene un espermicida. Se inserta en la vagina despus de mojarla y antes  de las [employer].  Espermicidas: son sustancias qumicas que matan o bloquean al esperma y no lo dejan  ingresar al cuello del tero y al tero. Vienen en forma de cremas, geles, supositorios, espuma o comprimidos. No es necesario tener Emergency planning/management officer. Se insertan en la vagina con un aplicador antes de Management consultant. El proceso debe repetirse cada vez que tiene relaciones sexuales. ANTICONCEPTIVOS INTRAUTERINOS  Dispositivo intrauterino (DIU) es un dispositivo en forma de T que se coloca en el tero durante el perodo menstrual, para Location manager. Hay dos tipos:  DIU de cobre: este tipo de DIU est recubierto con un alambre de cobre y se inserta dentro del tero. El cobre hace que el tero y las trompas de Falopio produzcan un liquido que [employer] espermatozoides. Puede permanecer colocado durante 10 aos.  DIU con hormona: este tipo de DIU contiene la hormona progestina (progesterona sinttica). La hormona espesa el moco cervical y evita que los espermatozoides ingresen al tero y tambin afina la membrana que cubre el tero para evitar la implantacin del vulo fertilizado. La hormona debilita o destruye los espermatozoides que ingresan al tero. Puede Geneticist, molecular durante 3-5 aos, segn el tipo de DIU que se [place]. MTODOS ANTICONCEPTIVOS PERMANENTES  Ligadura de trompas en la mujer: se realiza sellando, atando u obstruyendo quirrgicamente las trompas de Falopio lo que impide que el vulo descienda hacia el tero.  Esterilizacin histeroscpica: Implica la colocacin de un pequeo espiral o la insercin en cada trompa de Falopio. El mdico utiliza una tcnica llamada histeroscopa para Primary school teacher procedimiento. El dispositivo produce la formacin de tejido Designer, television/film set. Esto da como resultado una obstruccin permanente de las trompas de Falopio, de modo que la esperma no pueda fertilizar el vulo. Demora alrededor de 3 meses despus del procedimiento hasta que el conducto se obstruye. Tendr que usar otro mtodo anticonceptivo durante al menos 3  meses.  Esterilizacin masculina: se realiza ligando los conductos por los que pasan los espermatozoides (vasectoma).Esto impide que el esperma ingrese a la vagina durante el acto sexual. Luego del procedimiento, el hombre puede eyacular lquido (semen). MTODOS DE PLANIFICACIN NATURAL  Planificacin familiar natural: consiste en no Management consultant o usar un mtodo de barrera (condn, [place], capuchn cervical) en los [employer] la mujer podra quedar [place].  Mtodo de calendario: consiste en el seguimiento de la duracin de cada ciclo menstrual y la identificacin de los perodos frtiles.  Mtodo de ovulacin: Paramedic las relaciones sexuales durante la ovulacin.  Mtodo sintotrmico: Advertising copywriter sexuales en la poca en la que se est ovulando, utilizando un termmetro y tendiendo en cuenta los sntomas de la ovulacin.  Mtodo postovulacin: Youth worker las relaciones sexuales para despus de haber ovulado. Independientemente del tipo o mtodo anticonceptivo que usted elija, es importante que use condones para protegerse contra las infecciones de transmisin sexual (ETS). Hable con su mdico con respecto a qu mtodo anticonceptivo es el ms apropiado para usted. Document Released: 08/24/2005 Document Revised: 04/26/2013 [facility] Patient Information 2015 [place], [place]. This information is not intended to replace advice given to you by your health care provider. Make sure you discuss any questions you have with your health care provider.

## 2014-05-10 ENCOUNTER — Ambulatory Visit (INDEPENDENT_AMBULATORY_CARE_PROVIDER_SITE_OTHER): Payer: Self-pay | Admitting: *Deleted

## 2014-05-10 VITALS — BP 110/58 | HR 91 | Temp 98.2°F

## 2014-05-10 DIAGNOSIS — O09213 Supervision of pregnancy with history of pre-term labor, third trimester: Principal | ICD-10-CM

## 2014-05-10 DIAGNOSIS — O09219 Supervision of pregnancy with history of pre-term labor, unspecified trimester: Secondary | ICD-10-CM

## 2014-05-10 DIAGNOSIS — O09893 Supervision of other high risk pregnancies, third trimester: Secondary | ICD-10-CM

## 2014-05-10 NOTE — Progress Notes (Signed)
17P injection given  

## 2014-05-17 ENCOUNTER — Ambulatory Visit (INDEPENDENT_AMBULATORY_CARE_PROVIDER_SITE_OTHER): Payer: Self-pay | Admitting: Family Medicine

## 2014-05-17 VITALS — BP 97/58 | HR 92 | Temp 98.4°F | Wt 155.8 lb

## 2014-05-17 DIAGNOSIS — O09219 Supervision of pregnancy with history of pre-term labor, unspecified trimester: Secondary | ICD-10-CM

## 2014-05-17 DIAGNOSIS — Z23 Encounter for immunization: Secondary | ICD-10-CM

## 2014-05-17 LAB — POCT URINALYSIS DIP (DEVICE)
Bilirubin Urine: NEGATIVE
Glucose, UA: 500 mg/dL — AB
Hgb urine dipstick: NEGATIVE
KETONES UR: NEGATIVE mg/dL
Leukocytes, UA: NEGATIVE
Nitrite: NEGATIVE
PH: 6.5 (ref 5.0–8.0)
PROTEIN: NEGATIVE mg/dL
SPECIFIC GRAVITY, URINE: 1.015 (ref 1.005–1.030)
Urobilinogen, UA: 0.2 mg/dL (ref 0.0–1.0)

## 2014-05-17 NOTE — Progress Notes (Signed)
Flu vaccine information sheet given-- to get today.

## 2014-05-17 NOTE — Progress Notes (Signed)
Patient without complaints.  Tolerating injections without side effects.  Continue weekly 17-P injections.  Denies vaginal bleeding, abnormal vaginal discharge, contractions, loss of fluid.  Reports good fetal activity.  Follow up in 2 weeks.

## 2014-05-17 NOTE — Patient Instructions (Signed)
Tercer trimestre de embarazo (Third Trimester of Pregnancy) El tercer trimestre va desde la semana29 hasta la 42, desde el sptimo hasta el noveno mes, y es la poca en la que el feto crece ms rpidamente. Hacia el final del noveno mes, el feto mide alrededor de 20pulgadas (45cm) de largo y pesa entre 6 y 10 libras (2,700 y 4,500kg).  CAMBIOS EN EL ORGANISMO Su organismo atraviesa por muchos cambios durante el embarazo, y estos varan de una mujer a otra.   Seguir aumentando de peso. Es de esperar que aumente entre 25 y 35libras (11 y 16kg) hacia el final del embarazo.  Podrn aparecer las primeras estras en las caderas, el abdomen y las mamas.  Puede tener necesidad de orinar con ms frecuencia porque el feto baja hacia la pelvis y ejerce presin sobre la vejiga.  Debido al embarazo podr sentir acidez estomacal con frecuencia.  Puede estar estreida, ya que ciertas hormonas enlentecen los movimientos de los msculos que empujan los desechos a travs de los intestinos.  Pueden aparecer hemorroides o abultarse e hincharse las venas (venas varicosas).  Puede sentir dolor plvico debido al aumento de peso y a que las hormonas del embarazo relajan las articulaciones entre los huesos de la pelvis. El dolor de espalda puede ser consecuencia de la sobrecarga de los msculos que soportan la postura.  Tal vez haya cambios en el cabello que pueden incluir su engrosamiento, crecimiento rpido y cambios en la textura. Adems, a algunas mujeres se les cae el cabello durante o despus del embarazo, o tienen el cabello seco o fino. Lo ms probable es que el cabello se le normalice despus del nacimiento del beb.  Las mamas seguirn creciendo y le dolern. A veces, puede haber una secrecin amarilla de las mamas llamada calostro.  El ombligo puede salir hacia afuera.  Puede sentir que le falta el aire debido a que se expande el tero.  Puede notar que el feto "baja" o lo siente ms bajo, en el  abdomen.  Puede tener una prdida de secrecin mucosa con sangre. Esto suele ocurrir en el trmino de unos pocos das a una semana antes de que comience el trabajo de parto.  El cuello del tero se vuelve delgado y blando (se borra) cerca de la fecha de parto. QU DEBE ESPERAR EN LOS EXMENES PRENATALES  Le harn exmenes prenatales cada 2semanas hasta la semana36. A partir de ese momento le harn exmenes semanales. Durante una visita prenatal de rutina:  La pesarn para asegurarse de que usted y el feto estn creciendo normalmente.  Le tomarn la presin arterial.  Le medirn el abdomen para controlar el desarrollo del beb.  Se escucharn los latidos cardacos fetales.  Se evaluarn los resultados de los estudios solicitados en visitas anteriores.  Le revisarn el cuello del tero cuando est prxima la fecha de parto para controlar si este se ha borrado. Alrededor de la semana36, el mdico le revisar el cuello del tero. Al mismo tiempo, realizar un anlisis de las secreciones del tejido vaginal. Este examen es para determinar si hay un tipo de bacteria, estreptococo Grupo B. El mdico le explicar esto con ms detalle. El mdico puede preguntarle lo siguiente:  Cmo le gustara que fuera el parto.  Cmo se siente.  Si siente los movimientos del beb.  Si ha tenido sntomas anormales, como prdida de lquido, sangrado, dolores de cabeza intensos o clicos abdominales.  Si tiene alguna pregunta. Otros exmenes o estudios de deteccin que pueden realizarse   durante el tercer trimestre incluyen lo siguiente:  Anlisis de sangre para controlar las concentraciones de hierro (anemia).  Controles fetales para determinar su salud, nivel de actividad y crecimiento. Si tiene alguna enfermedad o hay problemas durante el embarazo, le harn estudios. FALSO TRABAJO DE PARTO Es posible que sienta contracciones leves e irregulares que finalmente desaparecen. Se llaman contracciones de  Braxton Hicks o falso trabajo de parto. Las contracciones pueden durar horas, das o incluso semanas, antes de que el verdadero trabajo de parto se inicie. Si las contracciones ocurren a intervalos regulares, se intensifican o se hacen dolorosas, lo mejor es que la revise el mdico.  SIGNOS DE TRABAJO DE PARTO   Clicos de tipo menstrual.  Contracciones cada 5minutos o menos.  Contracciones que comienzan en la parte superior del tero y se extienden hacia abajo, a la zona inferior del abdomen y la espalda.  Sensacin de mayor presin en la pelvis o dolor de espalda.  Una secrecin de mucosidad acuosa o con sangre que sale de la vagina. Si tiene alguno de estos signos antes de la semana37 del embarazo, llame a su mdico de inmediato. Debe concurrir al hospital para que la controlen inmediatamente. INSTRUCCIONES PARA EL CUIDADO EN EL HOGAR   Evite fumar, consumir hierbas, beber alcohol y tomar frmacos que no le hayan recetado. Estas sustancias qumicas afectan la formacin y el desarrollo del beb.  Siga las indicaciones del mdico en relacin con el uso de medicamentos. Durante el embarazo, hay medicamentos que son seguros de tomar y otros que no.  Haga actividad fsica solo en la forma indicada por el mdico. Sentir clicos uterinos es un buen signo para detener la actividad fsica.  Contine comiendo alimentos que sanos con regularidad.  Use un sostn que le brinde buen soporte si le duelen las mamas.  No se d baos de inmersin en agua caliente, baos turcos ni saunas.  Colquese el cinturn de seguridad cuando conduzca.  No coma carne cruda ni queso sin cocinar; evite el contacto con las bandejas sanitarias de los gatos y la tierra que estos animales usan. Estos elementos contienen grmenes que pueden causar defectos congnitos en el beb.  Tome las vitaminas prenatales.  Si est estreida, pruebe un laxante suave (si el mdico lo autoriza). Consuma ms alimentos ricos en  fibra, como vegetales y frutas frescos y cereales integrales. Beba gran cantidad de lquido para mantener la orina de tono claro o color amarillo plido.  Dese baos de asiento con agua tibia para aliviar el dolor o las molestias causadas por las hemorroides. Use una crema para las hemorroides si el mdico la autoriza.  Si tiene venas varicosas, use medias de descanso. Eleve los pies durante 15minutos, 3 o 4veces por da. Limite la cantidad de sal en su dieta.  Evite levantar objetos pesados, use zapatos de tacones bajos y mantenga una buena postura.  Descanse con las piernas elevadas si tiene calambres o dolor de cintura.  Visite a su dentista si no lo ha hecho durante el embarazo. Use un cepillo de dientes blando para higienizarse los dientes y psese el hilo dental con suavidad.  Puede seguir manteniendo relaciones sexuales, a menos que el mdico le indique lo contrario.  No haga viajes largos excepto que sea absolutamente necesario y solo con la autorizacin del mdico.  Tome clases prenatales para entender, practicar y hacer preguntas sobre el trabajo de parto y el parto.  Haga un ensayo de la partida al hospital.  Prepare el bolso que   llevar al hospital.  Prepare la habitacin del beb.  Concurra a todas las visitas prenatales segn las indicaciones de su mdico. SOLICITE ATENCIN MDICA SI:  No est segura de que est en trabajo de parto o de que ha roto la bolsa de las aguas.  Tiene mareos.  Siente clicos leves, presin en la pelvis o dolor persistente en el abdomen.  Tiene nuseas, vmitos o diarrea persistentes.  Tiene secrecin vaginal con mal olor.  Siente dolor al orinar. SOLICITE ATENCIN MDICA DE INMEDIATO SI:   Tiene fiebre.  Tiene una prdida de lquido por la vagina.  Tiene sangrado o pequeas prdidas vaginales.  Siente dolor intenso o clicos en el abdomen.  Sube o baja de peso rpidamente.  Tiene dificultad para respirar y siente dolor de  pecho.  Sbitamente se le hinchan mucho el rostro, las manos, los tobillos, los pies o las piernas.  No ha sentido los movimientos del beb durante una hora.  Siente un dolor de cabeza intenso que no se alivia con medicamentos.  Hay cambios en la visin. Document Released: 06/03/2005 Document Revised: 08/29/2013 ExitCare Patient Information 2015 ExitCare, [employer]. This information is not intended to replace advice given to you by your health care provider. Make sure you discuss any questions you have with your health care provider.  

## 2014-05-24 ENCOUNTER — Ambulatory Visit (INDEPENDENT_AMBULATORY_CARE_PROVIDER_SITE_OTHER): Payer: Self-pay

## 2014-05-24 VITALS — BP 106/54 | HR 91 | Temp 98.7°F | Wt 157.1 lb

## 2014-05-24 DIAGNOSIS — O09219 Supervision of pregnancy with history of pre-term labor, unspecified trimester: Secondary | ICD-10-CM

## 2014-05-24 DIAGNOSIS — O09893 Supervision of other high risk pregnancies, third trimester: Secondary | ICD-10-CM

## 2014-05-24 DIAGNOSIS — O09213 Supervision of pregnancy with history of pre-term labor, third trimester: Principal | ICD-10-CM

## 2014-05-24 NOTE — Progress Notes (Signed)
Spanish interpreter Albertina Senegal present during this encounter. 1ml 17P administered to RUO quadrant of buttocks. Patient tolerated well. To return in 1 week 05/31/14 for OBFU and injection.

## 2014-05-31 ENCOUNTER — Encounter: Payer: Self-pay | Admitting: Obstetrics and Gynecology

## 2014-05-31 ENCOUNTER — Ambulatory Visit (INDEPENDENT_AMBULATORY_CARE_PROVIDER_SITE_OTHER): Payer: Self-pay | Admitting: Obstetrics and Gynecology

## 2014-05-31 VITALS — BP 106/53 | HR 90 | Temp 98.2°F | Wt 158.8 lb

## 2014-05-31 DIAGNOSIS — O0993 Supervision of high risk pregnancy, unspecified, third trimester: Secondary | ICD-10-CM

## 2014-05-31 DIAGNOSIS — O09213 Supervision of pregnancy with history of pre-term labor, third trimester: Principal | ICD-10-CM

## 2014-05-31 DIAGNOSIS — O0933 Supervision of pregnancy with insufficient antenatal care, third trimester: Secondary | ICD-10-CM

## 2014-05-31 DIAGNOSIS — O0990 Supervision of high risk pregnancy, unspecified, unspecified trimester: Secondary | ICD-10-CM

## 2014-05-31 DIAGNOSIS — O09893 Supervision of other high risk pregnancies, third trimester: Secondary | ICD-10-CM

## 2014-05-31 DIAGNOSIS — O0930 Supervision of pregnancy with insufficient antenatal care, unspecified trimester: Secondary | ICD-10-CM

## 2014-05-31 DIAGNOSIS — O09219 Supervision of pregnancy with history of pre-term labor, unspecified trimester: Secondary | ICD-10-CM

## 2014-05-31 LAB — POCT URINALYSIS DIP (DEVICE)
Bilirubin Urine: NEGATIVE
Glucose, UA: 250 mg/dL — AB
HGB URINE DIPSTICK: NEGATIVE
Ketones, ur: NEGATIVE mg/dL
NITRITE: NEGATIVE
PH: 6 (ref 5.0–8.0)
PROTEIN: NEGATIVE mg/dL
SPECIFIC GRAVITY, URINE: 1.02 (ref 1.005–1.030)
UROBILINOGEN UA: 0.2 mg/dL (ref 0.0–1.0)

## 2014-05-31 NOTE — Progress Notes (Signed)
Patient is doing well without complaints. FM/PTL precautions reviewed. Continue weekly 17-P

## 2014-06-07 ENCOUNTER — Ambulatory Visit (INDEPENDENT_AMBULATORY_CARE_PROVIDER_SITE_OTHER): Payer: Self-pay | Admitting: *Deleted

## 2014-06-07 VITALS — BP 105/59 | HR 92 | Temp 98.4°F

## 2014-06-07 DIAGNOSIS — O09893 Supervision of other high risk pregnancies, third trimester: Secondary | ICD-10-CM

## 2014-06-07 DIAGNOSIS — O09213 Supervision of pregnancy with history of pre-term labor, third trimester: Secondary | ICD-10-CM

## 2014-06-14 ENCOUNTER — Ambulatory Visit (INDEPENDENT_AMBULATORY_CARE_PROVIDER_SITE_OTHER): Payer: Self-pay | Admitting: Family

## 2014-06-14 VITALS — BP 107/57 | HR 97 | Temp 98.5°F | Wt 160.8 lb

## 2014-06-14 DIAGNOSIS — O0993 Supervision of high risk pregnancy, unspecified, third trimester: Secondary | ICD-10-CM

## 2014-06-14 LAB — POCT URINALYSIS DIP (DEVICE)
Bilirubin Urine: NEGATIVE
Glucose, UA: 100 mg/dL — AB
HGB URINE DIPSTICK: NEGATIVE
Ketones, ur: NEGATIVE mg/dL
LEUKOCYTES UA: NEGATIVE
NITRITE: NEGATIVE
PH: 6.5 (ref 5.0–8.0)
PROTEIN: NEGATIVE mg/dL
Specific Gravity, Urine: 1.025 (ref 1.005–1.030)
UROBILINOGEN UA: 0.2 mg/dL (ref 0.0–1.0)

## 2014-06-14 NOTE — Progress Notes (Signed)
No questions or concerns. 17p today.  Fetus in transverse lie.  Reevaluate at next visit, may need version with belly binder. Baby moving a lot during visit.  No report of preterm labor symptoms.

## 2014-06-28 ENCOUNTER — Ambulatory Visit (INDEPENDENT_AMBULATORY_CARE_PROVIDER_SITE_OTHER): Payer: Self-pay | Admitting: Family Medicine

## 2014-06-28 VITALS — BP 120/62 | HR 94 | Wt 163.6 lb

## 2014-06-28 DIAGNOSIS — IMO0002 Reserved for concepts with insufficient information to code with codable children: Secondary | ICD-10-CM

## 2014-06-28 DIAGNOSIS — Q897 Multiple congenital malformations, not elsewhere classified: Secondary | ICD-10-CM

## 2014-06-28 DIAGNOSIS — O0993 Supervision of high risk pregnancy, unspecified, third trimester: Secondary | ICD-10-CM

## 2014-06-28 DIAGNOSIS — O09213 Supervision of pregnancy with history of pre-term labor, third trimester: Secondary | ICD-10-CM

## 2014-06-28 DIAGNOSIS — O09893 Supervision of other high risk pregnancies, third trimester: Secondary | ICD-10-CM

## 2014-06-28 DIAGNOSIS — O09212 Supervision of pregnancy with history of pre-term labor, second trimester: Secondary | ICD-10-CM

## 2014-06-28 LAB — POCT URINALYSIS DIP (DEVICE)
Bilirubin Urine: NEGATIVE
Glucose, UA: 500 mg/dL — AB
Hgb urine dipstick: NEGATIVE
KETONES UR: NEGATIVE mg/dL
LEUKOCYTES UA: NEGATIVE
NITRITE: NEGATIVE
PH: 6 (ref 5.0–8.0)
PROTEIN: NEGATIVE mg/dL
Specific Gravity, Urine: 1.025 (ref 1.005–1.030)
UROBILINOGEN UA: 1 mg/dL (ref 0.0–1.0)

## 2014-06-28 LAB — OB RESULTS CONSOLE GC/CHLAMYDIA
Chlamydia: NEGATIVE
GC PROBE AMP, GENITAL: NEGATIVE

## 2014-06-28 LAB — OB RESULTS CONSOLE GBS: STREP GROUP B AG: NEGATIVE

## 2014-06-28 MED ORDER — PRENATAL VITAMINS PLUS 27-1 MG PO TABS
1.0000 | ORAL_TABLET | Freq: Every day | ORAL | Status: AC
Start: 2014-06-28 — End: ?

## 2014-06-28 NOTE — Addendum Note (Signed)
Addended by: Candelaria StagersHAIZLIP, Odester Nilson E on: 06/28/2014 10:59 AM   Modules accepted: Orders

## 2014-06-28 NOTE — Patient Instructions (Signed)
Versin ceflica externa (External Cephalic Version) La versin ceflica externa es dar vuelta a un beb que tiene las nalgas hacia adelante o est de costado en el tero (trasverso) para colocarlo en una posicin de cabeza. Esto agiliza el trabajo de parto y el nacimiento, lo hace ms seguro para la madre y el beb y Sports coachdisminuye la posibilidad de Warehouse managertener que realizar una cesrea. No debera realizarse hasta que el embarazo sea de 36 semanas o ms. ANTES DEL PROCEDIMIENTO  No tome aspirina  No coma durante las cuatro horas previas al procedimiento.  Comente al mdico si tiene resfro, fiebre o una infeccin.  Informe a su mdico si sufre contracciones.  Informe al mdico si tiene una prdida de lquido por la vagina.  Informe al mdico si tiene hemorragia vaginal o secrecin anormal.  Si es admitida el mismo da de la [place], Oceanographerconcurra al hospital al menos una hora antes del procedimiento para leer y Oceanographerfirmar los formularios y Development worker, communityconsentimiento, y Theme park managerestar preparada.  Consulte con el profesional que lo asiste si ha tenido algn problema con anestsicos anteriormente.  Informe al mdico si ha estado tomado medicamentos. Esto incluye medicamentos de venta libre y con receta, hierbas, gotas oftlmicas y cremas. PROCEDIMIENTO  Primero se realiza un ultrasonido para asegurarse de que el beb est de nalgas o trasverso.  Se realizar una prueba de no estrs o perfil biofsico al beb antes de la VCE. Esto se realiza para asegurarse de que es seguro realizar la VCE al beb. Tambin podr realizarse despus del procedimiento para asegurarse de que el beb est bien.  La VCE se realiza en la sala de parto con la presencia de Heritage managerun anestesilogo. Deber haber instrumental para una cesrea de emergencia con un equipo de mdicos disponible.  Se le dar a la paciente medicacin para Yahoorelajar los msculos uterinos. Se podr administrar una vacuna epidural para cualquier molestia. Esto es til para el xito de la  VCE.  Tambin se coloca un monitor electrnico fetal durante el procedimiento para asegurarse de que el beb est bien.  Si la madre es Rh negativo, se administrar Rho-gam para prevenir problemas de Rh en los embarazos futuros.  La madre quedar en observacin durante 2 a 3 horas despus del procedimiento para asegurarse de que no ha habido problemas. BENEFICIOS DE LA VCE  Trabajo de parto ms fcil y seguro para la madre y para el beb.  Menor incidencia de Copycesrea.  Menores costos por parto vaginal. RIESGOS DE LA VCE  La placenta se desplaza de la pared del tero antes del parto (abrupcin de la placenta).  Ruptura del tero, en especial en pacientes con corte de cesrea previo.  Estrs fetal.  Parto prematuro.  Ruptura prematura de las High Bridgemembranas.  El beb puede volver a ponerse de nalgas o trasverso.  Puede ocurrir Newmont Miningmuerte fetal, pero esto es muy poco comn. LA VCE DEBER DETENERSE SI:  El ritmo cardaco fetal decae.  La madre siente AmerisourceBergen Corporationmucho dolor.  No se puede dar vuelta al beb despus de varios intentos. LA VCE NO DEBER EFECTUARSE SI:  La prueba de no estrs o perfil biofsico es anormal.  Hay hemorragia vaginal.  Forma anormal del tero.  Hay insuficiencia cardaca o presin alta no controlada en la madre.  Embarazo de gemelos o ms bebs.  La placenta cubre la apertura del cuello del tero (placenta previa).  Ha tenido una cesrea anterior con una incisin clsica o ciruga mayor del tero.  Insuficiencia de lquido Ross Storesamnitico en la  bolsa del beb (oligohidramnios).  El beb es muy pequeo o no se ha desarrollado normalmente (anomala).  Ruptura de Clay Centermembranas. INSTRUCCIONES PARA EL CUIDADO DOMICILIARIO  Pdale a alguna persona que la lleve hasta su domicilio despus del procedimiento.  Haga reposo en su casa durante varias horas.  Haga que alguien permanezca con usted cuando regrese a su casa, al menos durante las primeras horas.  Luego de la VCE,  contine con las actividades prenatales segn se le haya indicado.  Contine con su dieta normal, reposo y 1 Robert Wood Johnson Placeactividades.  No realice actividades estresantes por un par Kinder Morgan Energyde das. SOLICITE ATENCIN MDICA DE INMEDIATO SI:  Presenta una hemorragia vaginal abundante.  Presenta una secrecin vaginal (la bolsa de agua podra haberse roto).  Tiene contracciones uterinas.  No siente que el beb se mueve, o percibe menos movimientos que antes.  Siente dolor abdominal.  La temperatura oral se eleva por encima de 38,9 C (102 F) o mayor. Document Released: 02/10/2008 Document Revised: 11/16/2011 [facility] CenterExitCare Patient Information 2015 [place]ExitCare, [place]. This information is not intended to replace advice given to you by your health care provider. Make sure you discuss any questions you have with your health care provider.

## 2014-06-28 NOTE — Progress Notes (Signed)
Patient needs refill on PNV

## 2014-06-28 NOTE — Progress Notes (Signed)
Patient without complaints.  Denies vaginal bleeding, abnormal vaginal discharge, contractions, loss of fluid.  Reports good fetal activity.  Labor precautions reviewed.  GBS and GC/CT obtained today. If baby hasn't turned in 1-2 weeks, will schedule for ECV.  Follow up in 1 weeks.

## 2014-06-29 LAB — GC/CHLAMYDIA PROBE AMP
CT PROBE, AMP APTIMA: NEGATIVE
GC Probe RNA: NEGATIVE

## 2014-06-30 LAB — CULTURE, BETA STREP (GROUP B ONLY)

## 2014-07-05 ENCOUNTER — Ambulatory Visit (INDEPENDENT_AMBULATORY_CARE_PROVIDER_SITE_OTHER): Payer: Self-pay | Admitting: Family

## 2014-07-05 VITALS — BP 105/57 | HR 74 | Temp 98.7°F | Wt 165.0 lb

## 2014-07-05 DIAGNOSIS — O0993 Supervision of high risk pregnancy, unspecified, third trimester: Secondary | ICD-10-CM

## 2014-07-05 LAB — POCT URINALYSIS DIP (DEVICE)
BILIRUBIN URINE: NEGATIVE
GLUCOSE, UA: NEGATIVE mg/dL
Hgb urine dipstick: NEGATIVE
Ketones, ur: NEGATIVE mg/dL
NITRITE: NEGATIVE
PH: 6.5 (ref 5.0–8.0)
Protein, ur: NEGATIVE mg/dL
Specific Gravity, Urine: 1.02 (ref 1.005–1.030)
Urobilinogen, UA: 0.2 mg/dL (ref 0.0–1.0)

## 2014-07-05 NOTE — Progress Notes (Signed)
Reports numbness of right hand, mainly at night.  Discussed carpal tunnel of pregnancy and advised wrist brace.  Reports increased pelvic pressure.  Denies vaginal bleeding or leaking of fluid.  Reviewed GBS results.

## 2014-07-09 ENCOUNTER — Encounter: Payer: Self-pay | Admitting: Obstetrics and Gynecology

## 2014-07-12 ENCOUNTER — Ambulatory Visit (INDEPENDENT_AMBULATORY_CARE_PROVIDER_SITE_OTHER): Payer: Self-pay | Admitting: Obstetrics & Gynecology

## 2014-07-12 VITALS — BP 121/58 | HR 91 | Temp 98.0°F | Wt 167.1 lb

## 2014-07-12 DIAGNOSIS — O0933 Supervision of pregnancy with insufficient antenatal care, third trimester: Secondary | ICD-10-CM

## 2014-07-12 DIAGNOSIS — O09293 Supervision of pregnancy with other poor reproductive or obstetric history, third trimester: Secondary | ICD-10-CM

## 2014-07-12 DIAGNOSIS — O09893 Supervision of other high risk pregnancies, third trimester: Secondary | ICD-10-CM

## 2014-07-12 DIAGNOSIS — Z789 Other specified health status: Secondary | ICD-10-CM

## 2014-07-12 DIAGNOSIS — O09213 Supervision of pregnancy with history of pre-term labor, third trimester: Secondary | ICD-10-CM

## 2014-07-12 LAB — POCT URINALYSIS DIP (DEVICE)
Bilirubin Urine: NEGATIVE
GLUCOSE, UA: NEGATIVE mg/dL
Hgb urine dipstick: NEGATIVE
KETONES UR: NEGATIVE mg/dL
Nitrite: NEGATIVE
PROTEIN: NEGATIVE mg/dL
Specific Gravity, Urine: 1.02 (ref 1.005–1.030)
Urobilinogen, UA: 0.2 mg/dL (ref 0.0–1.0)
pH: 6.5 (ref 5.0–8.0)

## 2014-07-12 NOTE — Patient Instructions (Signed)
Return to clinic for any obstetric concerns or go to [facility]U for evaluation  

## 2014-07-12 NOTE — Progress Notes (Signed)
Spanish interpreter Douglass RiversMarly present for check in.

## 2014-07-12 NOTE — Progress Notes (Signed)
Patient is Spanish-speaking only, Spanish interpreter present for this encounter. Pelvic exam deferred until next visit. No other complaints or concerns.  Labor and fetal movement precautions reviewed.

## 2014-07-19 ENCOUNTER — Encounter: Payer: Self-pay | Admitting: Obstetrics & Gynecology

## 2014-07-19 ENCOUNTER — Ambulatory Visit (INDEPENDENT_AMBULATORY_CARE_PROVIDER_SITE_OTHER): Payer: Self-pay | Admitting: Obstetrics & Gynecology

## 2014-07-19 VITALS — BP 123/69 | HR 102 | Temp 98.1°F | Wt 167.3 lb

## 2014-07-19 DIAGNOSIS — O368130 Decreased fetal movements, third trimester, not applicable or unspecified: Secondary | ICD-10-CM

## 2014-07-19 DIAGNOSIS — O0990 Supervision of high risk pregnancy, unspecified, unspecified trimester: Secondary | ICD-10-CM

## 2014-07-19 DIAGNOSIS — O368131 Decreased fetal movements, third trimester, fetus 1: Secondary | ICD-10-CM

## 2014-07-19 LAB — POCT URINALYSIS DIP (DEVICE)
Bilirubin Urine: NEGATIVE
Glucose, UA: NEGATIVE mg/dL
HGB URINE DIPSTICK: NEGATIVE
Ketones, ur: NEGATIVE mg/dL
NITRITE: NEGATIVE
PH: 6 (ref 5.0–8.0)
Protein, ur: NEGATIVE mg/dL
Specific Gravity, Urine: 1.025 (ref 1.005–1.030)
Urobilinogen, UA: 0.2 mg/dL (ref 0.0–1.0)

## 2014-07-19 NOTE — Progress Notes (Signed)
Routine visit. Some decrease in FM. Labor precautions reviewed. Interpreter used.NST-reviewed and reactive.  Labor precautions reviewed.

## 2014-07-19 NOTE — Progress Notes (Signed)
Used interpreter Hexion Specialty Chemicalsaquel Mora. C/o edema in hands/ feet and fingers feel numb. C/o baby moving less since Monday - about 2-3 times per day and cramping yesterday. C/o SOB sometimes because " feels  Like baby is up top of abdomen".

## 2014-07-20 ENCOUNTER — Encounter (HOSPITAL_COMMUNITY): Payer: Self-pay | Admitting: *Deleted

## 2014-07-20 ENCOUNTER — Inpatient Hospital Stay (HOSPITAL_COMMUNITY)
Admission: AD | Admit: 2014-07-20 | Discharge: 2014-07-22 | DRG: 775 | Disposition: A | Discharge status: Home / Self Care | Payer: Medicaid Other | Source: Ambulatory Visit | Attending: Obstetrics and Gynecology | Admitting: Obstetrics and Gynecology

## 2014-07-20 DIAGNOSIS — O09893 Supervision of other high risk pregnancies, third trimester: Secondary | ICD-10-CM

## 2014-07-20 DIAGNOSIS — O09293 Supervision of pregnancy with other poor reproductive or obstetric history, third trimester: Secondary | ICD-10-CM

## 2014-07-20 DIAGNOSIS — IMO0001 Reserved for inherently not codable concepts without codable children: Secondary | ICD-10-CM

## 2014-07-20 DIAGNOSIS — Z3A39 39 weeks gestation of pregnancy: Secondary | ICD-10-CM | POA: Diagnosis present

## 2014-07-20 DIAGNOSIS — Z789 Other specified health status: Secondary | ICD-10-CM

## 2014-07-20 DIAGNOSIS — O471 False labor at or after 37 completed weeks of gestation: Secondary | ICD-10-CM | POA: Diagnosis present

## 2014-07-20 DIAGNOSIS — O09213 Supervision of pregnancy with history of pre-term labor, third trimester: Secondary | ICD-10-CM

## 2014-07-20 DIAGNOSIS — O0990 Supervision of high risk pregnancy, unspecified, unspecified trimester: Secondary | ICD-10-CM

## 2014-07-20 DIAGNOSIS — O0933 Supervision of pregnancy with insufficient antenatal care, third trimester: Secondary | ICD-10-CM

## 2014-07-20 LAB — CBC
HCT: 39.2 % (ref 36.0–46.0)
HEMOGLOBIN: 13.7 g/dL (ref 12.0–15.0)
MCH: 30 pg (ref 26.0–34.0)
MCHC: 34.9 g/dL (ref 30.0–36.0)
MCV: 86 fL (ref 78.0–100.0)
Platelets: 165 10*3/uL (ref 150–400)
RBC: 4.56 MIL/uL (ref 3.87–5.11)
RDW: 14.5 % (ref 11.5–15.5)
WBC: 11.1 10*3/uL — ABNORMAL HIGH (ref 4.0–10.5)

## 2014-07-20 LAB — RPR

## 2014-07-20 MED ORDER — PRENATAL MULTIVITAMIN CH
1.0000 | ORAL_TABLET | Freq: Every day | ORAL | Status: DC
  Administered 2014-07-21 – 2014-07-22 (×2): 1 via ORAL
  Filled 2014-07-20 (×2): qty 1

## 2014-07-20 MED ORDER — CITRIC ACID-SODIUM CITRATE 334-500 MG/5ML PO SOLN: 30.0000 mL | ORAL | Status: DC | PRN

## 2014-07-20 MED ORDER — FENTANYL CITRATE 0.05 MG/ML IJ SOLN
100.0000 ug | INTRAMUSCULAR | Status: DC | PRN
  Administered 2014-07-20: 100 ug via INTRAVENOUS
  Filled 2014-07-20: qty 2

## 2014-07-20 MED ORDER — OXYTOCIN BOLUS FROM INFUSION: 500.0000 mL | INTRAVENOUS | Status: DC

## 2014-07-20 MED ORDER — OXYCODONE-ACETAMINOPHEN 5-325 MG PO TABS: 1.0000 | ORAL_TABLET | ORAL | Status: DC | PRN

## 2014-07-20 MED ORDER — OXYTOCIN 10 UNIT/ML IJ SOLN
INTRAMUSCULAR | Status: AC
  Filled 2014-07-20: qty 1

## 2014-07-20 MED ORDER — ONDANSETRON HCL 4 MG/2ML IJ SOLN: 4.0000 mg | INTRAMUSCULAR | Status: DC | PRN

## 2014-07-20 MED ORDER — DIBUCAINE 1 % RE OINT: 1.0000 "application " | TOPICAL_OINTMENT | RECTAL | Status: DC | PRN

## 2014-07-20 MED ORDER — OXYTOCIN 40 UNITS IN LACTATED RINGERS INFUSION - SIMPLE MED
62.5000 mL/h | INTRAVENOUS | Status: DC
  Administered 2014-07-20: 62.5 mL/h via INTRAVENOUS

## 2014-07-20 MED ORDER — BISACODYL 10 MG RE SUPP: 10.0000 mg | Freq: Every day | RECTAL | Status: DC | PRN

## 2014-07-20 MED ORDER — ONDANSETRON HCL 4 MG/2ML IJ SOLN: 4.0000 mg | Freq: Four times a day (QID) | INTRAMUSCULAR | Status: DC | PRN

## 2014-07-20 MED ORDER — OXYTOCIN 40 UNITS IN LACTATED RINGERS INFUSION - SIMPLE MED: 62.5000 mL/h | INTRAVENOUS | Status: DC | PRN

## 2014-07-20 MED ORDER — OXYCODONE-ACETAMINOPHEN 5-325 MG PO TABS: 2.0000 | ORAL_TABLET | ORAL | Status: DC | PRN

## 2014-07-20 MED ORDER — IBUPROFEN 600 MG PO TABS
600.0000 mg | ORAL_TABLET | Freq: Four times a day (QID) | ORAL | Status: DC
  Administered 2014-07-20 – 2014-07-22 (×9): 600 mg via ORAL
  Filled 2014-07-20 (×9): qty 1

## 2014-07-20 MED ORDER — SODIUM CHLORIDE 0.9 % IJ SOLN: 3.0000 mL | INTRAMUSCULAR | Status: DC | PRN

## 2014-07-20 MED ORDER — LANOLIN HYDROUS EX OINT: TOPICAL_OINTMENT | CUTANEOUS | Status: DC | PRN

## 2014-07-20 MED ORDER — WITCH HAZEL-GLYCERIN EX PADS: 1.0000 "application " | MEDICATED_PAD | CUTANEOUS | Status: DC | PRN

## 2014-07-20 MED ORDER — OXYCODONE-ACETAMINOPHEN 5-325 MG PO TABS
1.0000 | ORAL_TABLET | ORAL | Status: DC | PRN
  Administered 2014-07-21 (×2): 1 via ORAL
  Filled 2014-07-20 (×2): qty 1

## 2014-07-20 MED ORDER — SODIUM CHLORIDE 0.9 % IJ SOLN: 3.0000 mL | Freq: Two times a day (BID) | INTRAMUSCULAR | Status: DC

## 2014-07-20 MED ORDER — SODIUM CHLORIDE 0.9 % IV SOLN: 250.0000 mL | INTRAVENOUS | Status: DC | PRN

## 2014-07-20 MED ORDER — FLEET ENEMA 7-19 GM/118ML RE ENEM: 1.0000 | ENEMA | Freq: Every day | RECTAL | Status: DC | PRN

## 2014-07-20 MED ORDER — BENZOCAINE-MENTHOL 20-0.5 % EX AERO
1.0000 "application " | INHALATION_SPRAY | CUTANEOUS | Status: DC | PRN
  Administered 2014-07-21: 1 via TOPICAL
  Filled 2014-07-20: qty 56

## 2014-07-20 MED ORDER — BUTORPHANOL TARTRATE 1 MG/ML IJ SOLN: 1.0000 mg | INTRAMUSCULAR | Status: DC | PRN

## 2014-07-20 MED ORDER — LACTATED RINGERS IV SOLN: INTRAVENOUS | Status: DC

## 2014-07-20 MED ORDER — FLEET ENEMA 7-19 GM/118ML RE ENEM: 1.0000 | ENEMA | RECTAL | Status: DC | PRN

## 2014-07-20 MED ORDER — LIDOCAINE HCL (PF) 1 % IJ SOLN
INTRAMUSCULAR | Status: AC
  Filled 2014-07-20: qty 30

## 2014-07-20 MED ORDER — SENNOSIDES-DOCUSATE SODIUM 8.6-50 MG PO TABS
2.0000 | ORAL_TABLET | ORAL | Status: DC
  Administered 2014-07-21 – 2014-07-22 (×2): 2 via ORAL
  Filled 2014-07-20 (×2): qty 2

## 2014-07-20 MED ORDER — ACETAMINOPHEN 325 MG PO TABS: 650.0000 mg | ORAL_TABLET | ORAL | Status: DC | PRN

## 2014-07-20 MED ORDER — DIPHENHYDRAMINE HCL 25 MG PO CAPS: 25.0000 mg | ORAL_CAPSULE | Freq: Four times a day (QID) | ORAL | Status: DC | PRN

## 2014-07-20 MED ORDER — OXYTOCIN 40 UNITS IN LACTATED RINGERS INFUSION - SIMPLE MED
INTRAVENOUS | Status: AC
  Filled 2014-07-20: qty 1000

## 2014-07-20 MED ORDER — SIMETHICONE 80 MG PO CHEW: 80.0000 mg | CHEWABLE_TABLET | ORAL | Status: DC | PRN

## 2014-07-20 MED ORDER — ONDANSETRON HCL 4 MG PO TABS: 4.0000 mg | ORAL_TABLET | ORAL | Status: DC | PRN

## 2014-07-20 MED ORDER — LIDOCAINE HCL (PF) 1 % IJ SOLN
30.0000 mL | INTRAMUSCULAR | Status: AC | PRN
  Administered 2014-07-20: 30 mL via SUBCUTANEOUS
  Filled 2014-07-20: qty 30

## 2014-07-20 MED ORDER — ZOLPIDEM TARTRATE 5 MG PO TABS: 5.0000 mg | ORAL_TABLET | Freq: Every evening | ORAL | Status: DC | PRN

## 2014-07-20 MED ORDER — LACTATED RINGERS IV SOLN: 500.0000 mL | INTRAVENOUS | Status: DC | PRN

## 2014-07-20 NOTE — Plan of Care (Signed)
Problem: Phase II Progression Outcomes Goal: Tolerating diet Outcome: Completed/Met Date Met:  07/20/14     

## 2014-07-20 NOTE — Progress Notes (Signed)
UR chart review completed.  

## 2014-07-20 NOTE — Plan of Care (Signed)
Problem: Phase I Progression Outcomes Goal: Voiding adequately Outcome: Completed/Met Date Met:  07/20/14

## 2014-07-20 NOTE — MAU Note (Signed)
Transferred in bed by RN and Harlon FlorJ. Wenzel, NP.  [facility]

## 2014-07-20 NOTE — H&P (Signed)
Alexis Marsh is a 28 y.o. female presenting for in active labor dilated to 8cm, declines epidural, desires to breast feed, no loss of fluid. . Maternal Medical History:  Reason for admission: Nausea.    OB History    Gravida Para Term Preterm AB TAB SAB Ectopic Multiple Living   3 2  2      2      Past Medical History  Diagnosis Date  . Preterm labor    Past Surgical History  Procedure Laterality Date  . No past surgeries     Family History: family history includes Hypertension in her mother. Social History:  reports that she has never smoked. She has never used smokeless tobacco. She reports that she does not drink alcohol or use illicit drugs.    Review of Systems  Constitutional: Negative for fever and chills.  Respiratory: Negative for cough and shortness of breath.   Cardiovascular: Negative for chest pain and leg swelling.  Gastrointestinal: Negative for heartburn, nausea, vomiting and diarrhea.  Genitourinary: Negative for dysuria, urgency, frequency and hematuria.  Neurological:       No headache    Dilation: 7 Effacement (%): 80 Station: -1 Exam by:: Dr. Lennie HummerAcostas Blood pressure 118/54, pulse 89, temperature 98 F (36.7 C), temperature source Oral, resp. rate 18, height 4\' 11"  (1.499 m), weight 167 lb (75.751 kg), last menstrual period 10/07/2013, SpO2 100 %, currently breastfeeding. Maternal Exam:  Uterine Assessment: Contraction strength is moderate.  Contraction frequency is regular.   Abdomen: Patient reports no abdominal tenderness. Introitus: Ferning test: not done.  Nitrazine test: not done. Amniotic fluid character: not assessed.  Pelvis: adequate for delivery.   Cervix: Cervix evaluated by digital exam.     Fetal Exam Fetal Monitor Review: Variability: moderate (6-25 bpm).   Pattern: accelerations present and no decelerations.    Fetal State Assessment: Category I - tracings are normal.     Physical Exam  Constitutional: She is  oriented to person, place, and time. She appears well-developed. She appears distressed (with contractions).  Cardiovascular: Normal rate.   Respiratory: Effort normal and breath sounds normal. No respiratory distress.  GI: Soft. She exhibits no distension.  Musculoskeletal: She exhibits no edema.  Neurological: She is alert and oriented to person, place, and time.  Skin: Skin is warm. No erythema.    Prenatal labs: ABO, Rh:   Antibody:   Rubella: Immune (06/11 0000) RPR: NON REAC (08/13 1547)  HBsAg: Negative (06/11 0000)  HIV: NONREACTIVE (08/13 1547)  GBS: Negative (10/22 0000)    Clinic  [facility] (Transferred from HD at 20 weeks)  Dating  Second trimester ultrasound  Genetic Screen Quad: normal                    Anatomic US  normal  GTT Early: failed 1hr, nl 3 hr               Third trimester: 109  TDaP vaccine 04/19/14  Flu vaccine  05/17/14  GBS  neg  Contraception  Undecided  Baby Food  breast  Pediatrician  [facility]  Support Person  Husband and mother in law     Assessment/Plan: MOC: OCPs, will discuss other forms with patient MOF: breast Labor: progressing normally, AROM at next check   Alexis Marsh 07/20/2014, 9:12 AM

## 2014-07-20 NOTE — MAU Note (Signed)
Contractions started at 0400, no bleeding or leaking.

## 2014-07-20 NOTE — Plan of Care (Signed)
Problem: Phase I Progression Outcomes Goal: Pain controlled with appropriate interventions Outcome: Completed/Met Date Met:  07/20/14 Goal: Foley catheter patent Outcome: Not Applicable Date Met:  17/40/99

## 2014-07-20 NOTE — Lactation Note (Signed)
This note was copied from the chart of Alexis Marsh. Lactation Consultation Note     Initial consult with this mom of a term baby, now 5 hours old. Mom is spanish speaking, and I had a spanish interpreter present in the room during the consult. I shoed mom how to hand express, and was able to express only tiny drops of colostrum. I reviewed the breast feeding pages in the  Baby in Me book, and reviewed lactation services with mom also. i assitsed mom in latching her baby in cross cradle hold. At first, the baby was sleepy, but once she was awake, she latched fairly easily, with intermittent strong suckles. She unlatched twice, and mom needed help to relatch the baby deeply. Mom ad trouble grasping baby tightly.  Mom knows to call  forquestions/concerns.   Patient Name: Alexis Marsh [id number]'MToday's Date: 07/20/2014 Reason for consult: Initial assessment   Maternal Data Formula Feeding for Exclusion: No Has patient been taught Hand Expression?: Yes Does the patient have breastfeeding experience prior to this delivery?: No  Feeding Feeding Type: Breast Fed Length of feed: 10 min (feeding in progress - I saw about 10 minutes of sucking)  LATCH Score/Interventions Latch: Repeated attempts needed to sustain latch, nipple held in mouth throughout feeding, stimulation needed to elicit sucking reflex. Intervention(s): Adjust position;Assist with latch;Breast compression  Audible Swallowing: None Intervention(s): Skin to skin;Hand expression  Type of Nipple: Everted at rest and after stimulation  Comfort (Breast/Nipple): Soft / non-tender     Hold (Positioning): Assistance needed to correctly position infant at breast and maintain latch. Intervention(s): Breastfeeding basics reviewed;Support Pillows;Position options;Skin to skin  LATCH Score: 6  Lactation Tools Discussed/Used     Consult Status Consult Status: Follow-up Date: 07/21/14 Follow-up type:  In-patient    Alfred LevinsLee, Esias Mory Anne 07/20/2014, 6:03 PM

## 2014-07-20 NOTE — Plan of Care (Signed)
Problem: Phase I Progression Outcomes Goal: IS, TCDB as ordered Outcome: Not Applicable Date Met:  31/42/76 Pt. Is a uncomplicated vaginal delivery without respiratory issues

## 2014-07-21 NOTE — Plan of Care (Signed)
Problem: Phase I Progression Outcomes Goal: OOB as tolerated unless otherwise ordered Outcome: Completed/Met Date Met:  07/21/14 Goal: VS, stable, temp < 100.4 degrees F Outcome: Completed/Met Date Met:  07/21/14 Goal: Initial discharge plan identified Outcome: Completed/Met Date Met:  07/21/14  Problem: Phase II Progression Outcomes Goal: Incision intact & without signs/symptoms of infection Outcome: Not Applicable Date Met:  40/76/80 Goal: Rh isoimmunization per orders Outcome: Not Applicable Date Met:  88/11/03  Problem: Discharge Progression Outcomes Goal: Remove staples per MD order Outcome: Not Applicable Date Met:  15/94/58

## 2014-07-21 NOTE — Progress Notes (Signed)
I assisted Academic librarianDebbie RN with some questions regarding pain, and explanation of plan fo care, by Orlan LeavensViria Alvarez, Interpreter

## 2014-07-21 NOTE — Plan of Care (Signed)
Problem: Discharge Progression Outcomes Goal: Pain controlled with appropriate interventions Outcome: Completed/Met Date Met:  07/21/14     

## 2014-07-21 NOTE — Plan of Care (Signed)
Problem: Phase I Progression Outcomes Goal: Other Phase I Outcomes/Goals Outcome: Not Applicable Date Met:  37/94/32  Problem: Phase II Progression Outcomes Goal: Pain controlled on oral analgesia Outcome: Completed/Met Date Met:  07/21/14 Goal: Progress activity as tolerated unless otherwise ordered Outcome: Completed/Met Date Met:  07/21/14 Goal: Afebrile, VS remain stable Outcome: Completed/Met Date Met:  07/21/14 Goal: Other Phase II Outcomes/Goals Outcome: Not Applicable Date Met:  76/14/70

## 2014-07-21 NOTE — Progress Notes (Signed)
Post Partum Day 1 Subjective:  Alexis Marsh is a 28 y.o. [phone] 6662w2d s/p SVD.  No acute events overnight.  Pt denies problems with ambulating, voiding or po intake.  She denies nausea or vomiting.  Pain is well controlled. Method of Feeding: breast  Objective: Blood pressure 103/57, pulse 81, temperature 97.8 F (36.6 C), temperature source Oral, resp. rate 16, height 4\' 11"  (1.499 m), weight 167 lb (75.751 kg), last menstrual period 10/07/2013, SpO2 100 %, unknown if currently breastfeeding.  Physical Exam:  General: alert, cooperative and no distress Lochia:normal flow Chest: CTAB Heart: RRR no m/r/g Abdomen: +BS, soft, nontender,  Uterine Fundus: firm DVT Evaluation: No evidence of DVT seen on physical exam. Extremities: no edema   Recent Labs  07/20/14 0840  HGB 13.7  HCT 39.2    Assessment/Plan:  ASSESSMENT: Alexis Marsh is a 28 y.o. [id number] 2062w2d s/p SVD  Plan for discharge tomorrow   LOS: 1 day   Deitrich Steve ROCIO 07/21/2014, 11:30 AM

## 2014-07-21 NOTE — Plan of Care (Signed)
Problem: Discharge Progression Outcomes Goal: Tolerating diet Outcome: Completed/Met Date Met:  07/21/14     

## 2014-07-22 MED ORDER — NORETHINDRONE 0.35 MG PO TABS: 1.0000 | ORAL_TABLET | Freq: Every day | ORAL | Status: AC

## 2014-07-22 MED ORDER — IBUPROFEN 600 MG PO TABS: 600.0000 mg | ORAL_TABLET | Freq: Four times a day (QID) | ORAL | Status: AC

## 2014-07-22 NOTE — Progress Notes (Signed)
Assisted Alexis Marsh with Lactation interpretation of instructions  Benita Mordecai MaesSanchez - Spanish Interpreter

## 2014-07-22 NOTE — Progress Notes (Signed)
Assisted Pediatrician with interpretation of discharge for baby.  Spanish Interpreter - Alexis Marsh

## 2014-07-22 NOTE — Plan of Care (Signed)
Problem: Discharge Progression Outcomes Goal: Barriers To Progression Addressed/Resolved Outcome: Completed/Met Date Met:  07/22/14 Goal: Activity appropriate for discharge plan Outcome: Completed/Met Date Met:  16/10/96 Goal: Complications resolved/controlled Outcome: Completed/Met Date Met:  07/22/14 Goal: Afebrile, VS remain stable at discharge Outcome: Completed/Met Date Met:  07/22/14 Goal: Discharge plan in place and appropriate Outcome: Completed/Met Date Met:  07/22/14 Goal: Other Discharge Outcomes/Goals Outcome: Not Applicable Date Met:  04/54/09

## 2014-07-22 NOTE — Progress Notes (Signed)
Assisted patient with ordering dinner and breakfast  Spanish Interpreter - Milda SmartBenita Mordecai MaesSanchez

## 2014-07-22 NOTE — Progress Notes (Signed)
Assisted RN with interpretation of discharge instructions for mother. Spanish Interpreter - Joselyn GlassmanBenita Sanchez

## 2014-07-22 NOTE — Discharge Summary (Signed)
Obstetric Discharge Summary Reason for Admission: onset of labor Prenatal Procedures: ultrasound and 17 p. Intrapartum Procedures: spontaneous vaginal delivery Postpartum Procedures: none Complications-Operative and Postpartum: 2nd degree perineal laceration HEMOGLOBIN  Date Value Ref Range Status  07/20/2014 13.7 12.0 - 15.0 g/dL Final  19/14/782906/07/2014 56.211.7 g/dL Final   HCT  Date Value Ref Range Status  07/20/2014 39.2 36.0 - 46.0 % Final  02/15/2014 35 % Final   S: Pt reports doing well.  Desires micronor and transition to IUD.  Reports decreased bleeding and pain.  Breastfeeding well.  Physical Exam:  Filed Vitals:   07/22/14 0553  BP: 107/67  Pulse: 73  Temp: 98.1 F (36.7 C)  Resp:     General: alert, cooperative and appears stated age 65Lochia: appropriate Uterine Fundus: firm Incision: n/a DVT Evaluation: No evidence of DVT seen on physical exam. Negative Homan's sign.  Discharge Diagnoses: Term Pregnancy-delivered  Discharge Information: Date: 07/22/2014 Activity: pelvic rest Diet: routine Medications: Ibuprofen and Micronor. Condition: stable Instructions: refer to practice specific booklet Discharge to: home Follow-up Information    [facility] Schedule an appointment as soon as possible for a visit in 6 weeks.   Contact information:   [address]  [place] [phone]      Newborn Data: Live born female  Birth Weight: 8 lb 8.5 oz (3870 g) APGAR: 9, 9  Home with mother.  Rochele PagesKARIM, WALIDAH N 07/22/2014, 7:25 AM

## 2014-07-22 NOTE — Progress Notes (Signed)
Checked on patients needs °Spanish Interpreter - Benita Sanchez °

## 2014-07-22 NOTE — Lactation Note (Signed)
This note was copied from the chart of Alexis Marsh. Lactation Consultation Note; Spanish interpreter , Benita at the bedside for all teaching. Mother informed to feed infant with cue's base and infant 8-12 times in 24 hours. Advised mother to offer breast before formula. Mother was given a hand pump with a #27 flange. Advised mother to post pump 15 mins on each breast until milk comes to volume. Observed colostrum with using hand pump. Mother's breast are filling. Advised mother in prevention and treatment of engorgement. Observed mother with infant latched in football hold. Observed frequent suckling and swallows. Mother advised to certify with WIC. Mother is aware of available LC services and community support.   Patient Name: Alexis Marsh ZOXWR'UToday's Date: 07/22/2014 Reason for consult: Follow-up assessment   Maternal Data    Feeding Feeding Type: Breast Fed Length of feed: 15 min  LATCH Score/Interventions Latch: Grasps breast easily, tongue down, lips flanged, rhythmical sucking. Intervention(s): Adjust position;Assist with latch;Breast massage;Breast compression  Audible Swallowing: Spontaneous and intermittent Intervention(s): Hand expression  Type of Nipple: Everted at rest and after stimulation  Comfort (Breast/Nipple): Filling, red/small blisters or bruises, mild/mod discomfort  Problem noted: Filling  Hold (Positioning): Assistance needed to correctly position infant at breast and maintain latch.  LATCH Score: 8  Lactation Tools Discussed/Used     Consult Status Consult Status: Complete    Michel BickersKendrick, Kaylianna Detert McCoy 07/22/2014, 3:58 PM

## 2014-07-22 NOTE — Discharge Instructions (Signed)
Cuidados luego de un parto por vía vaginal °(Postpartum Care After Vaginal Delivery) °Luego del nacimiento del bebé deberá permanecer en el hospital durante 24 a 72 horas, excepto que hubiera existido algún problema, o usted sufra alguna enfermedad. Mientras se encuentre en el hospital recibirá ayuda e instrucciones por parte de las enfermeras y el médico, quienes cuidarán de usted y su bebé y le darán consejos para amamantarlo correctamente, especialmente si es el primer hijo.  °En caso de ser necesario, le prescribirán analgésicos. Observará una pequeña hemorragia vaginal y deberá cambiar los apósitos con frecuencia. Lávese las manos cuidadosamente con agua y jabón durante al menos 20 segundos luego de cambiarse el apósito o ir al baño. Si elimina coágulos o aumenta la hemorragia, infórmelo a la enfermera. No deseche los coágulos sanguíneos antes de mostrárselos a la enfermera, para asegurarse de que no es tejido placentario. °Si le han colocado una vía intravenosa, se la retirarán dentro de las 24 horas, si no hay problemas. La primera vez que se levante de la cama o tome una ducha, llame a la enfermera para que la ayude que puede sentirse débil, mareada o desmayarse. Si está amamantando, puede sentir contracciones dolorosas en el útero durante algunas semanas. Esto es normal y necesario, ya que de este modo el útero vuelve a su tamaño normal. Si no está amamantando, utilice un sostén de soporte y trate de no tocarse las mamas hasta que haya dejado de producir leche. No deben administrarse hormonas para suprimir la leche, debido a que pueden causar coágulos sanguíneos. Podrá seguir una dieta normal, excepto que sufra diabetes o presente otros problemas de salud.  °La enfermera colocará bolsas con hielo en el sitio de la episiotomía (agrandamiento quirúrgico de la apertura vaginal) para reducir el dolor y la hinchazón. En algunos casos raros hay dificultad para orinar, entonces la enfermera deberá vaciarle la  vejiga con un catéter. Si le han practicado una ligadura tubaria durante el posparto ("trompas atadas", esterilización femenina), esto no hará que permanezca más tiempo en el hospital. °Podrá tener al bebé en su habitación todo el tiempo que lo desee si el bebé no tiene ningún problema. Lleve y traiga al bebé de la nursery dentro de la cunita. No lo lleve en brazos. No abandone el área de posparto. Si la madre es Rh negativa (falta de una proteína en los glóbulos rojos) y el bebé es Rh positivo, la madre debe aplicarse la vacuna RhoGam para evitar problemas con el factor Rh en futuros embarazos °Le darán instrucciones por escrito para usted y el bebé y los medicamentos necesarios cuando reciba el alta médica. Asegúrese que comprende y sigue las indicaciones. °INSTRUCCIONES PARA EL CUIDADO DOMICILIARIO °· Siga las instrucciones y tome los medicamentos que le indicaron cuando le dieron el alta médica.  °· Utilice los medicamentos de venta libre o de prescripción para el dolor, el malestar o la fiebre, según se lo indique el profesional que lo asiste.  °· No tome aspirina, ya que puede causar hemorragias.  °· Aumente sus actividades un poco cada día para tener más fuerza y resistencia.  °· No beba alcohol, especialmente si está amamantando o toma analgésicos.  °· Tómese la temperatura dos veces por día y regístrela.  °· Podrá tener una pequeña hemorragia durante 2 a 4 semanas. Esto es normal.  °· No utilice tampones o duchas vaginales, use toallas higiénicas.  °· Trate de que alguna persona permanezca con usted y la ayude durante los primeros días en el hogar.  °·   Descanse o duerma una siesta cuando el bebé duerma.  °· Si está amamantando, use un buen sostén. Si no está amamantando, use un buen sostén y no estimule los pezones.  °· Consuma una dieta sana y siga tomando las vitaminas prenatales.  °· No conduzca vehículos, no realice actividades pesadas ni viaje hasta que su médico la autorice.  °· No mantenga relaciones  sexuales hasta que el médico lo permita.  °· Consulte con el profesional cuando puede comenzar a realizar actividad física y que tipo de ejercicios puede hacer.  °· Comuníquese inmediatamente con el médico si tiene problemas luego del parto.  °· Comuníquese con el pediatra si tiene problemas con el bebé.  °· Programe su visita de control luego del parto y cúmplala.  °SOLICITE ATENCIÓN MÉDICA SI: °· La temperatura se eleva por encima de 100° F (37.8° C).  °· Aumenta la hemorragia vaginal o elimina coágulos. Conserve algunos coágulos para mostrárselos al médico.  °· Observa sangre o siente dolor al orinar.  °· Presenta secreción vaginal con olor fétido.  °· Aumenta el dolor o la inflamación en el sitio de la episiotomía (agrandamiento quirúrgico de la apertura vaginal).  °· Sufre una cefalea grave.  °· Se siente deprimida.  °· La incisión se abre.  °· Se siente mareada o sufre un desmayo.  °· Aparece una erupción cutánea.  °· Tiene una reacción o problemas con su medicamento.  °· Siente dolor u observa enrojecimiento e hinchazón en el sitio de la vía intravenosa.  °SOLICITE ATENCIÓN MÉDICA DE INMEDIATO SI: °· Siente dolor en el pecho.  °· Comienza a sentir falta de aire.  °· Se desmaya.  °· Siente dolor, con o sin hinchazón e irritación en la pierna.  °· Tiene una hemorragia vaginal abundante, con o sin coágulos  °· Siente dolor en el estómago.  °· Observa una secreción vaginal con mal olor.  °ASEGURESE QUE:  °· Comprende estas instrucciones.  °· Controlará su enfermedad.  °· Solicitará ayuda de inmediato si no mejora o empeora.  °Document Released: 06/21/2007 Document Revised: 08/13/2011 °[employer]® Patient Information ©2012 [employer]. °

## 2014-07-26 ENCOUNTER — Other Ambulatory Visit: Payer: Self-pay

## 2014-09-10 ENCOUNTER — Ambulatory Visit: Payer: Self-pay | Admitting: Obstetrics & Gynecology

## 2014-11-06 IMAGING — US US OB DETAIL+14 WK
1 series · 12 of 28 positions shown · non-contrast
Comparison: none

[Series 1: us ob detail +14 wk · 12 of 53 slices shown]
[im 2/53]
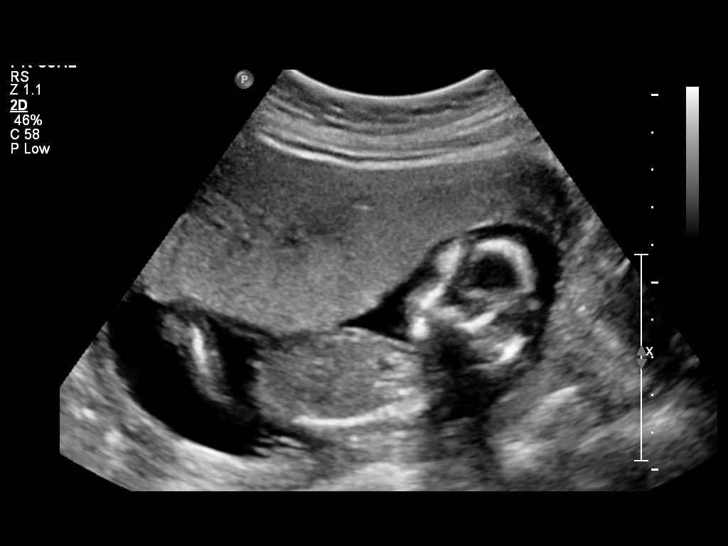
[im 6/53]
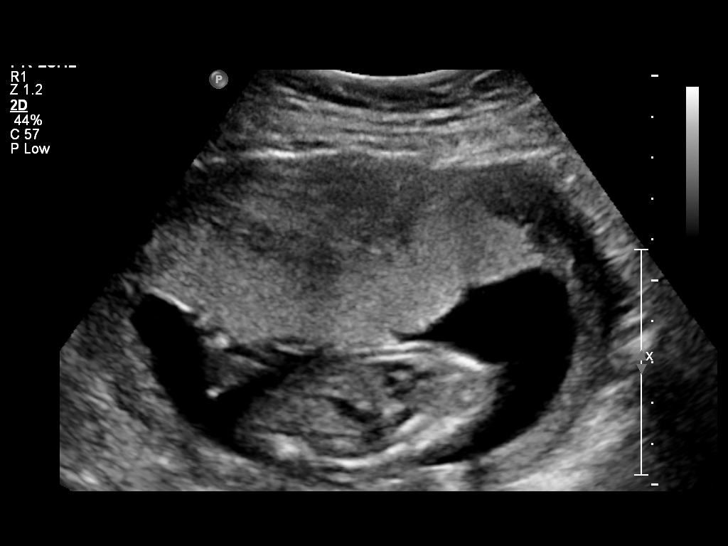
[im 10/53]
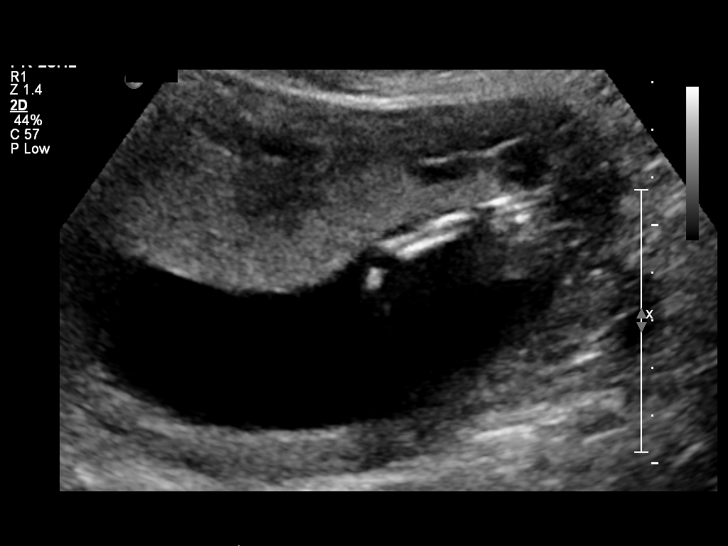
[im 16/53]
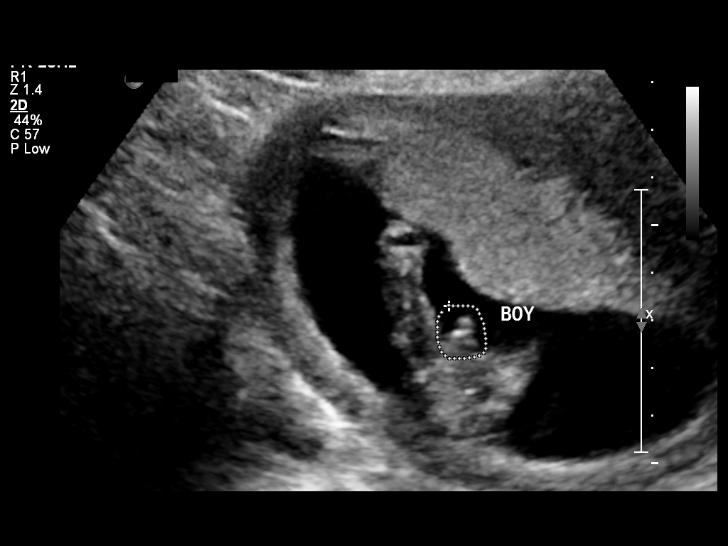
[im 20/53]
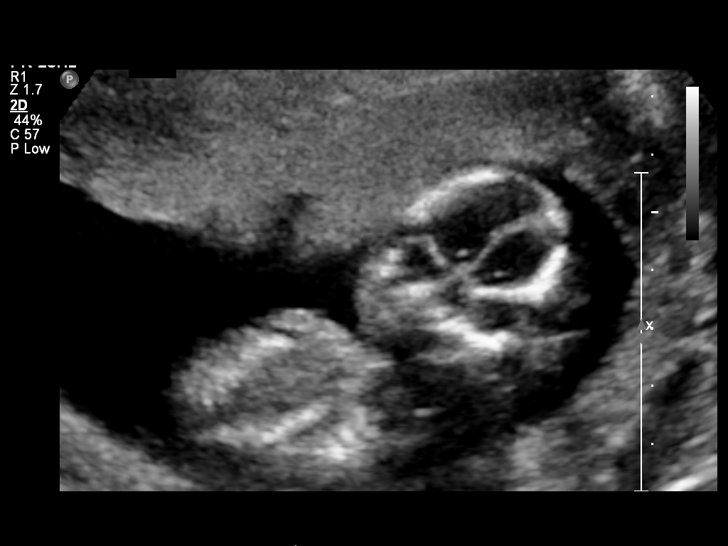
[im 24/53]
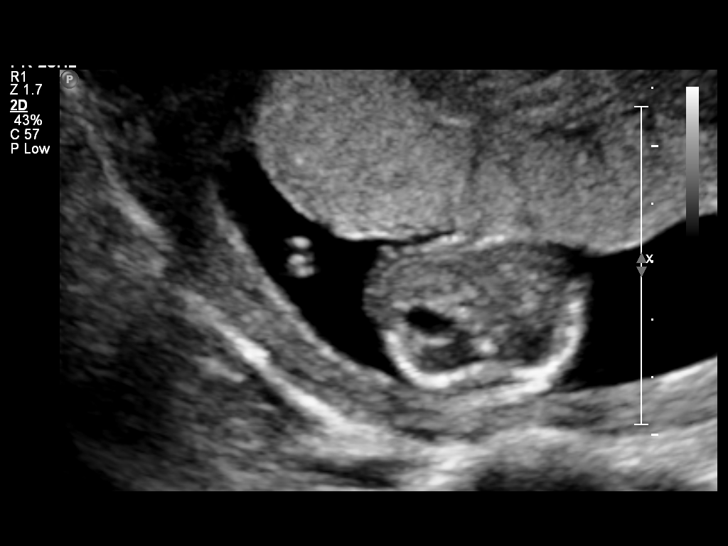
[im 29/53]
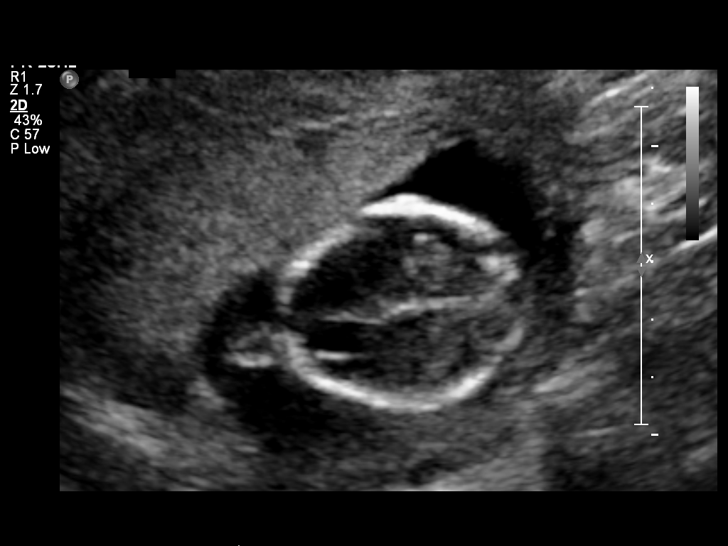
[im 33/53]
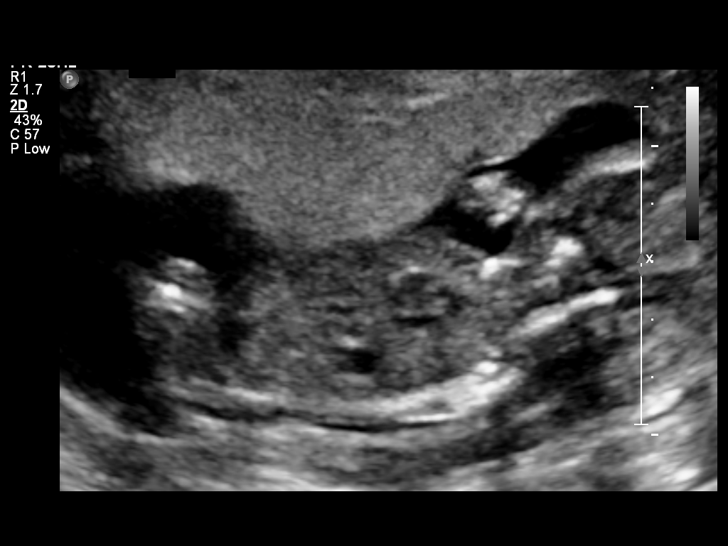
[im 37/53]
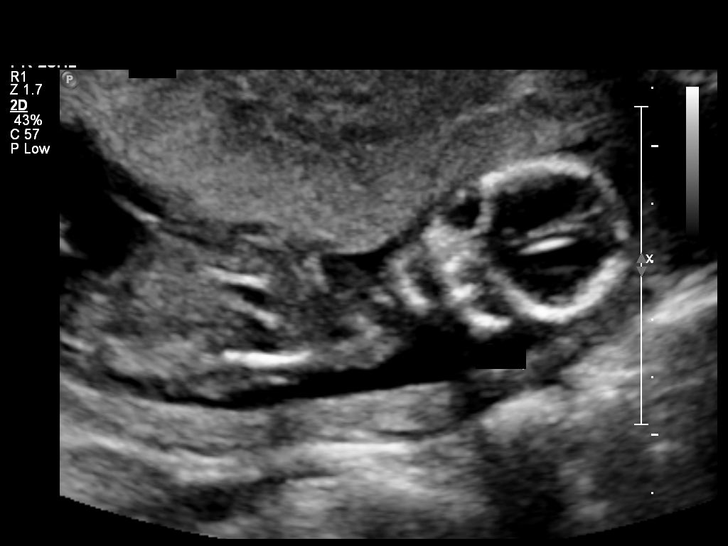
[im 43/53]
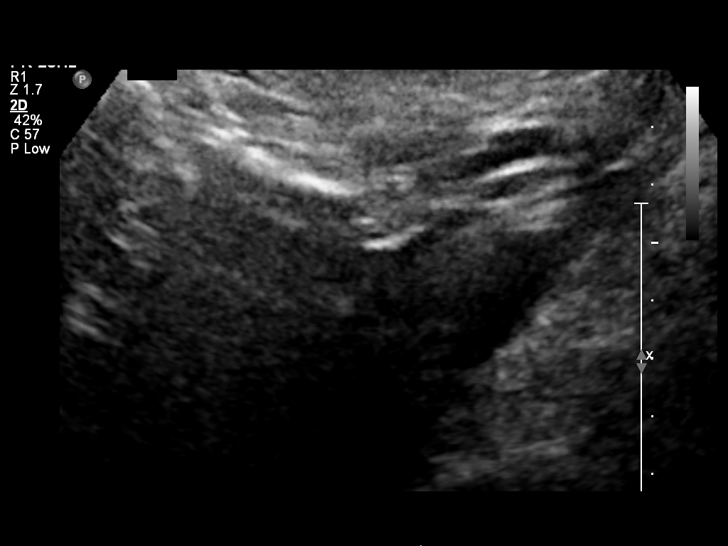
[im 47/53]
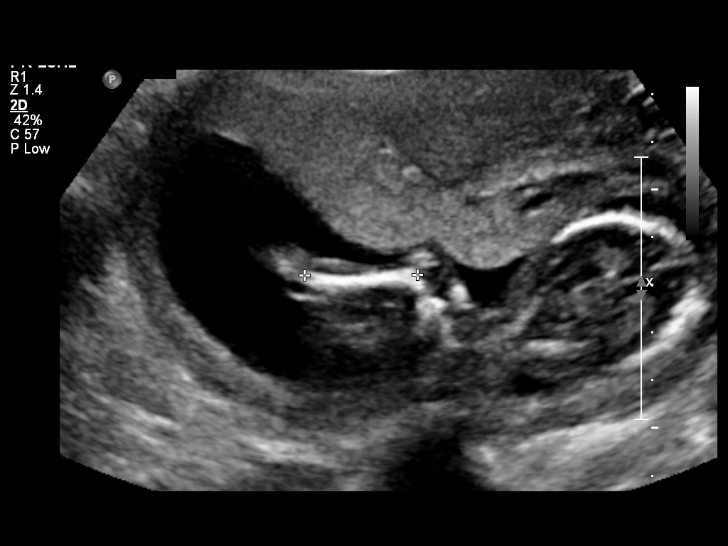
[im 51/53]
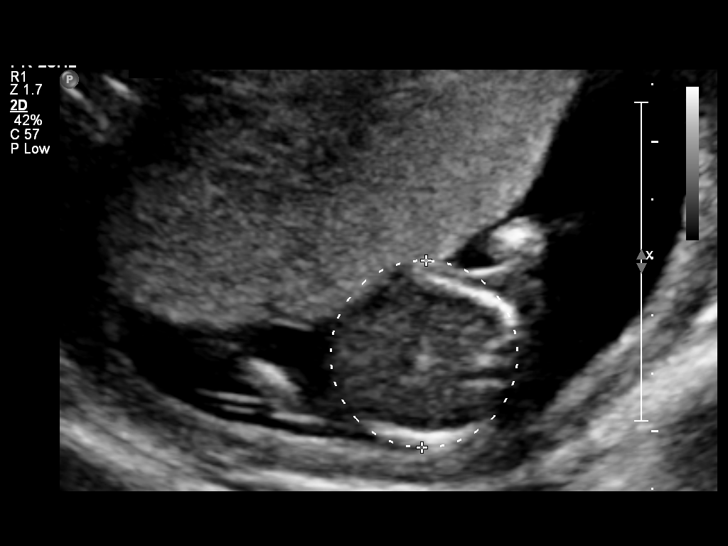

[12 of 28 positions shown; findings below may reference images not displayed]

OBSTETRICS REPORT
                      (Signed Final 02/14/2013 [DATE])

Service(s) Provided

 US OB DETAIL + 14 WK                                  76811.0
Indications

 Uncertain LMP;  Establish Gestational [AGE]
 Detailed fetal anatomic survey
Fetal Evaluation

 Num Of Fetuses:    1
 Fetal Heart Rate:  152                         bpm
 Cardiac Activity:  Observed
 Presentation:      Cephalic
 Placenta:          Anterior, above cervical os
 P. Cord            Not well visualized
 Insertion:

 Amniotic Fluid
 AFI FV:      Subjectively within normal limits
                                             Larg Pckt:     3.1  cm
Biometry

 BPD:     34.7  mm    G. Age:   16w 5d                CI:        77.58   70 - 86
                                                      FL/HC:      16.4   13.3 -

 HC:     124.7  mm    G. Age:   16w 2d       37  %    HC/AC:      1.23   1.05 -

 AC:     101.7  mm    G. Age:   16w 1d       47  %    FL/BPD:
 FL:      20.4  mm    G. Age:   16w 1d       37  %    FL/AC:      20.1   20 - 24
 HUM:     23.3  mm    G. Age:   17w 1d       79  %
 NFT:     2.41  mm

 Est. FW:     147  gm      0 lb 5 oz     61  %
Gestational Age

 U/S Today:     16w 2d                                        EDD:   07/30/13
 Best:          16w 2d    Det. By:   U/S (02/14/13)           EDD:   07/30/13
Anatomy

 Cranium:          Appears normal         Aortic Arch:      Not well visualized
 Fetal Cavum:      Appears normal         Ductal Arch:      Not well visualized
 Ventricles:       Appears normal         Diaphragm:        Not well visualized
 Choroid Plexus:   Appears normal         Stomach:          Appears normal
 Cerebellum:       Appears normal         Abdomen:          Appears normal
 Posterior Fossa:  Appears normal         Abdominal Wall:   Appears nml (cord
                                                            insert, abd wall)
 Nuchal Fold:      Appears normal         Cord Vessels:     Appears normal (3
                                                            vessel cord)
 Face:             Appears normal         Kidneys:          Appear normal
                   (orbits and profile)
 Lips:             Appears normal         Bladder:          Appears normal
 Heart:            Not well visualized    Spine:            Not well visualized
 RVOT:             Not well visualized    Lower             Appears normal
                                          Extremities:
 LVOT:             Not well visualized    Upper             Appears normal
                                          Extremities:

 Other:  Hands not well seen. Nasal bone visualized. Male gender.
         Technically difficult due to early GA.
Cervix Uterus Adnexa

 Cervical Length:   3.3       cm

 Cervix:       Normal appearance by transabdominal scan.
 Left Ovary:   Not visualized.
 Right Ovary:  Not visualized.

 Adnexa:     No adnexal mass visualized.
Impression

 Single living intrauterine pregnancy in cephalic presentation.
 The estimated gestational age is 16w 2d based on U/S
 (02/14/13). Anatomic survey is limited by early gestation.
 Follow-up anatomy scan is recommended.

## 2015-11-14 IMAGING — US US OB DETAIL+14 WK
1 series · 12 of 28 positions shown · non-contrast
Comparison: none

[Series 1: us ob detail +14 wk · 104 acquisitions, 12 frames shown]
[im 4/104]
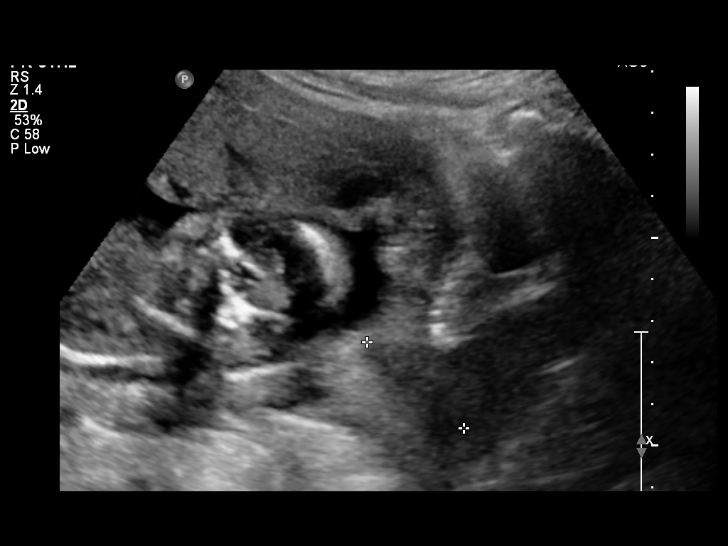
[im 12/104]
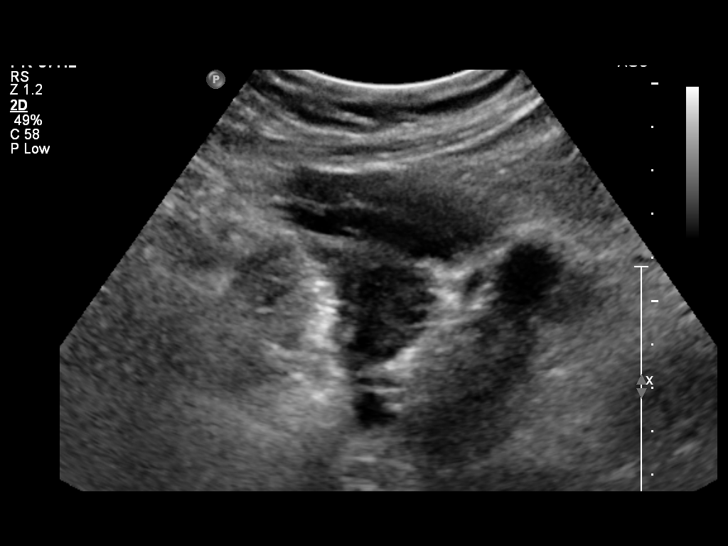
[im 20/104]
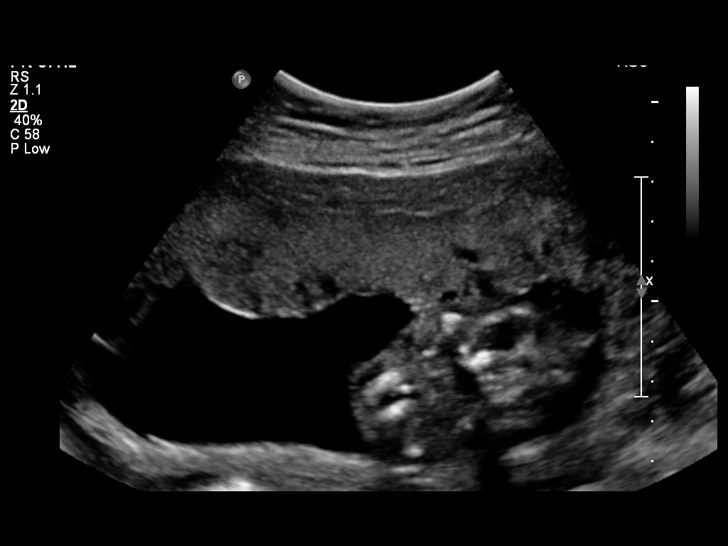
[im 31/104]
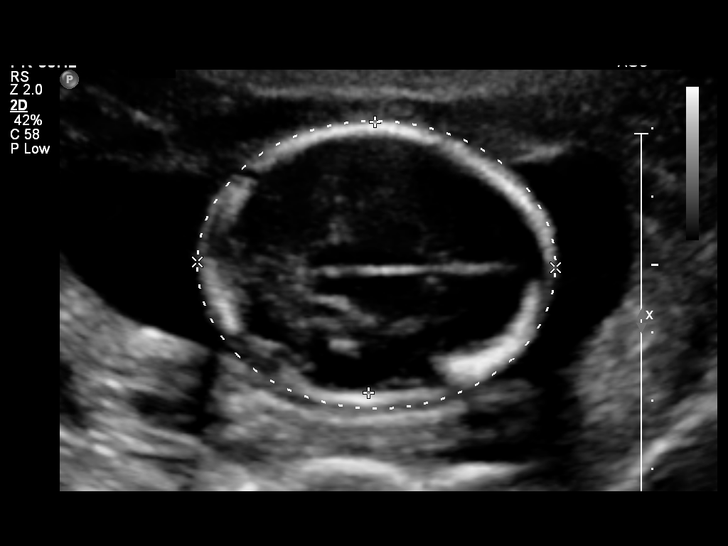
[im 39/104]
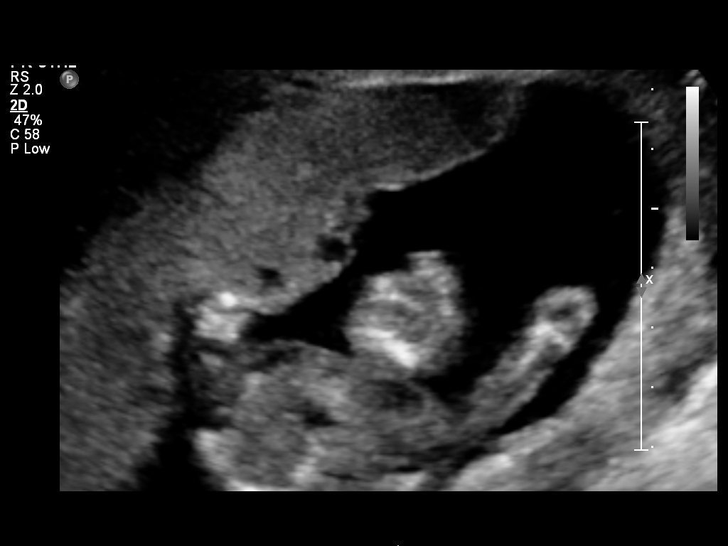
[im 46/104]
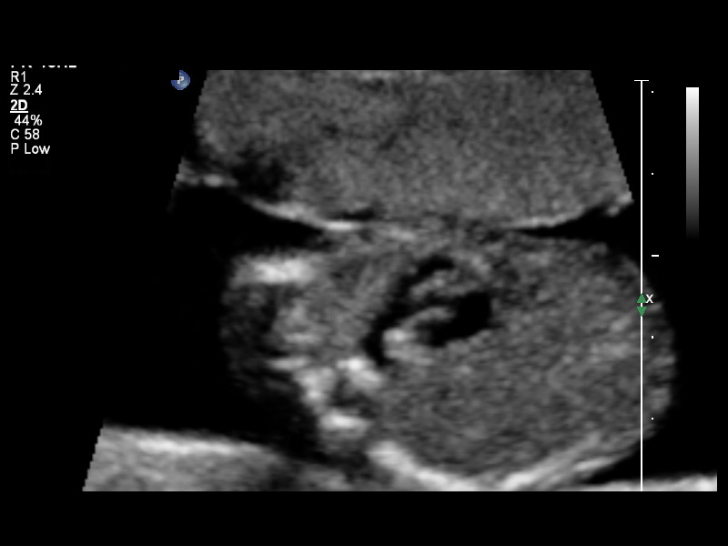
[im 58/104]
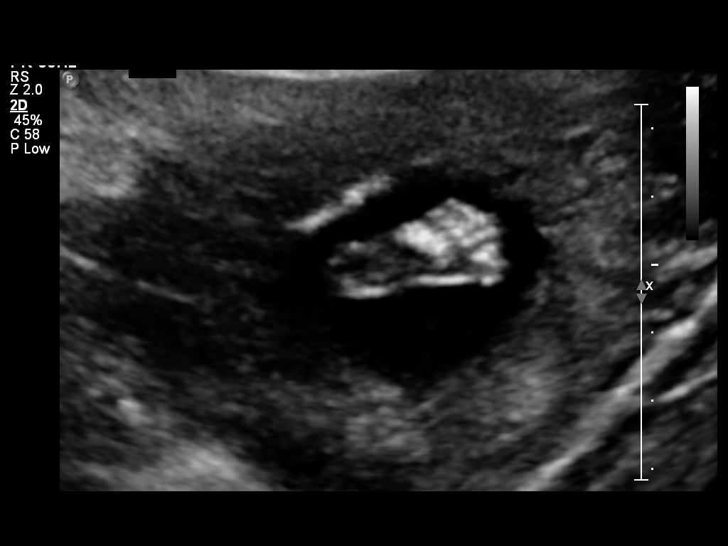
[im 65/104]
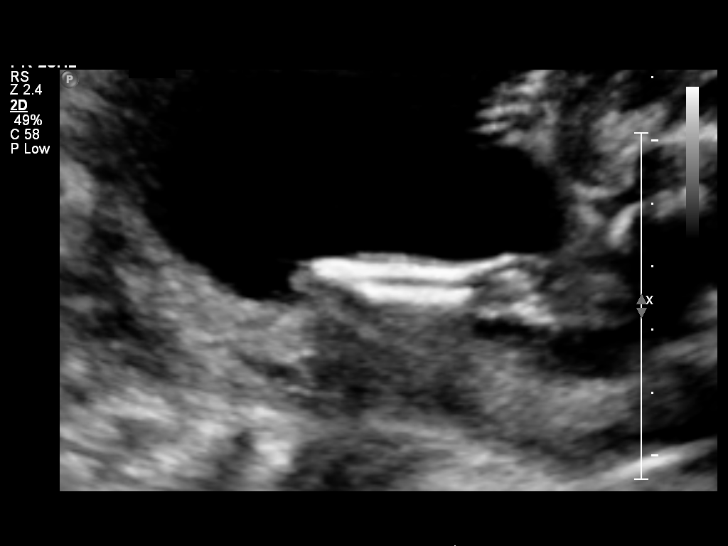
[im 73/104]
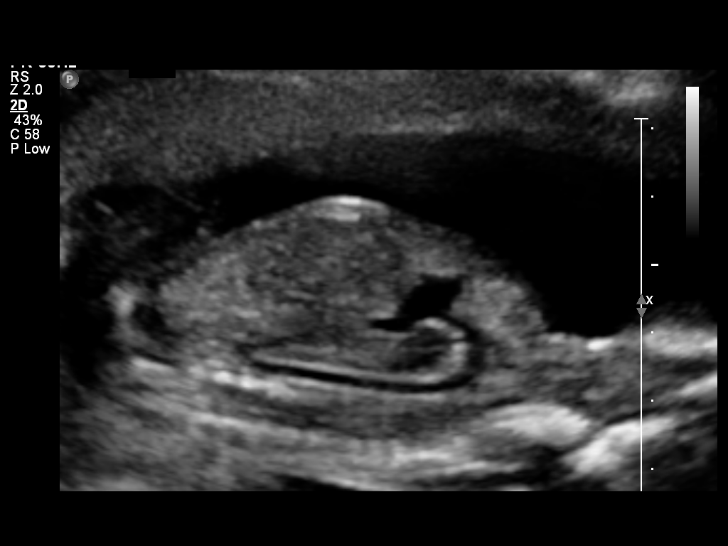
[im 84/104]
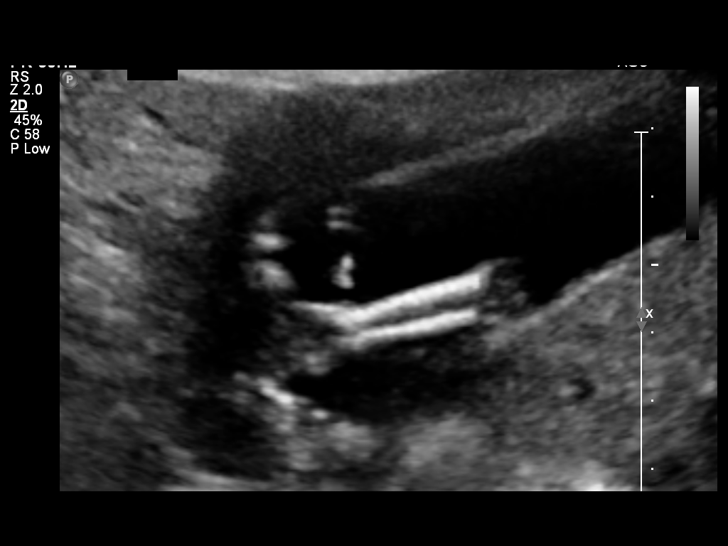
[im 92/104]
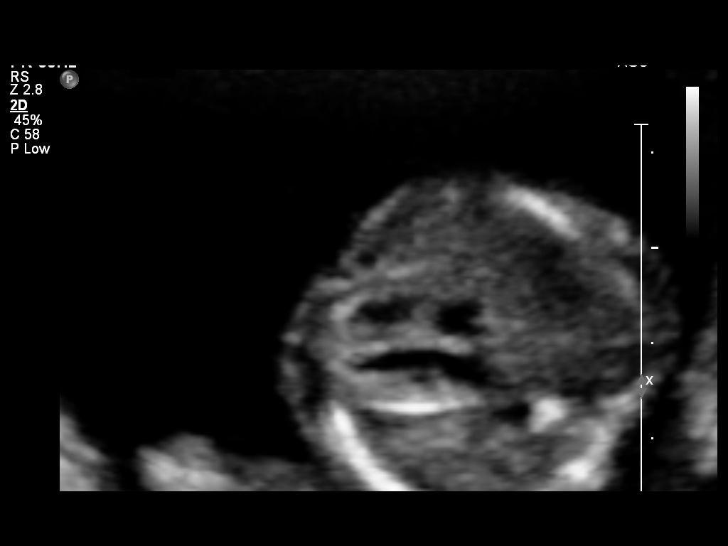
[im 100/104]
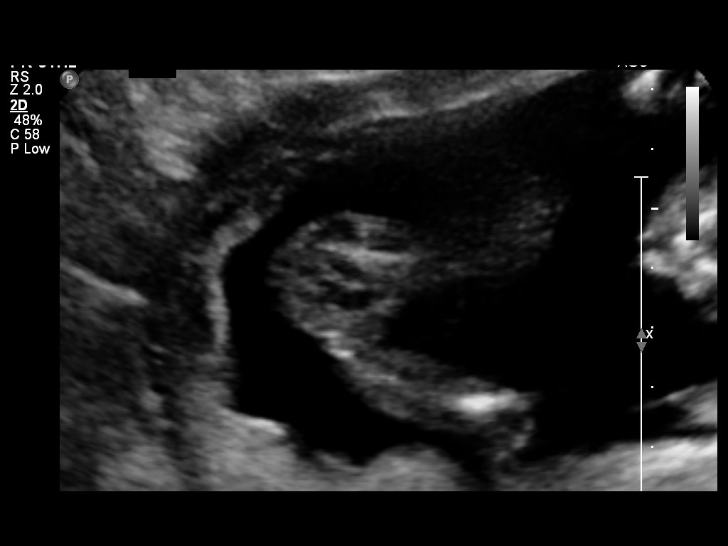

[12 of 28 positions shown; findings below may reference images not displayed]

OBSTETRICS REPORT
                      (Signed Final 02/22/2014 [DATE])

Service(s) Provided

 US OB DETAIL + 14 WK                                  76811.0
Indications

 Detailed fetal anatomic survey
 Poor obstetric history: Previous preterm delivery
 x2(1st at 35 wks,2nd at 33w 2d)
 History of genetic / anatomic abnormality - Child
 with multiple abnormalities
 Poor obstetric history (prior pre-term labor)
Fetal Evaluation

 Num Of Fetuses:    1
 Fetal Heart Rate:  149                          bpm
 Cardiac Activity:  Observed
 Presentation:      Cephalic
 Placenta:          Anterior, above cervical os
 P. Cord            Visualized
 Insertion:

 Amniotic Fluid
 AFI FV:      Subjectively within normal limits
                                             Larg Pckt:     3.9  cm
Biometry

 BPD:     39.6  mm     G. Age:  18w 0d                CI:        71.52   70 - 86
                                                      FL/HC:      17.4   15.8 -
                                                                         18
 HC:     149.1  mm     G. Age:  18w 0d       36  %    HC/AC:      1.14   1.07 -

 AC:     130.8  mm     G. Age:  18w 4d       63  %    FL/BPD:
 FL:        26  mm     G. Age:  17w 6d       36  %    FL/AC:      19.9   20 - 24
 HUM:     25.9  mm     G. Age:  18w 1d       55  %
 NFT:      3.2  mm

 Est. FW:     230  gm      0 lb 8 oz     50  %
Gestational Age

 LMP:           19w 5d        Date:  10/07/13                 EDD:   07/14/14
 U/S Today:     18w 1d                                        EDD:   07/25/14
 Best:          18w 1d     Det. By:  U/S (02/22/14)           EDD:   07/25/14
Anatomy

 Cranium:          Appears normal         Aortic Arch:      Appears normal
 Fetal Cavum:      Appears normal         Ductal Arch:      Appears normal
 Ventricles:       Appears normal         Diaphragm:        Appears normal
 Choroid Plexus:   Appears normal         Stomach:          Appears normal, left
                                                            sided
 Cerebellum:       Appears normal         Abdomen:          Appears normal
 Posterior Fossa:  Appears normal         Abdominal Wall:   Appears nml (cord
                                                            insert, abd wall)
 Nuchal Fold:      Appears normal         Cord Vessels:     Appears normal (3
                                                            vessel cord)
 Face:             Appears normal         Kidneys:          Appear normal
                   (orbits and profile)
 Lips:             Appears normal         Bladder:          Appears normal
 Heart:            Appears normal         Spine:            Not well visualized
                   (4CH, axis, and
                   situs)
 RVOT:             Appears normal         Lower             Appears normal
                                          Extremities:
 LVOT:             Appears normal         Upper             Appears normal
                                          Extremities:

 Other:  Fetus appears to be a female. Heels and 5th digit appear normal.
         Technically difficult due to fetal position.
Cervix Uterus Adnexa

 Cervical Length:    3.1      cm

 Cervix:       Normal appearance by transabdominal scan.
 Left Ovary:    Within normal limits.
 Right Ovary:   Within normal limits.

 Adnexa:     No abnormality visualized.
Impression

 Single IUP at 18w 1d by today's ultrasound
 Hx of previous child with multiple anomalies
 Normal anatomic fetal survey
 No markers associated with aneuploidy noted
 Normal amniotic fluid volume
Recommendations

 Recommend adjusting EDD based on today's study
 Would offer 17-P due to history of prior preterm deliveries
 Follow-up ultrasounds as clinically indicated.
